# Patient Record
Sex: Male | Born: 2000 | Race: White | Hispanic: No | Marital: Single | Smoking: Former smoker
Health system: Southern US, Community
[De-identification: ages and names within clinical notes are randomized; demographics above are authoritative.]

## PROBLEM LIST (undated history)

## (undated) DIAGNOSIS — N2881 Hypertrophy of kidney: Secondary | ICD-10-CM

## (undated) DIAGNOSIS — N5089 Other specified disorders of the male genital organs: Secondary | ICD-10-CM

## (undated) HISTORY — PX: APPENDECTOMY: SHX54

---
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
## 2018-07-24 ENCOUNTER — Emergency Department (HOSPITAL_COMMUNITY)
Admission: EM | Admit: 2018-07-24 | Discharge: 2018-07-24 | Disposition: A | Discharge status: Home / Self Care | Payer: No Typology Code available for payment source | Attending: Emergency Medicine | Admitting: Emergency Medicine

## 2018-07-24 ENCOUNTER — Encounter (HOSPITAL_COMMUNITY): Payer: Self-pay

## 2018-07-24 ENCOUNTER — Emergency Department (HOSPITAL_COMMUNITY): Payer: No Typology Code available for payment source

## 2018-07-24 ENCOUNTER — Other Ambulatory Visit: Payer: Self-pay

## 2018-07-24 DIAGNOSIS — Y92410 Unspecified street and highway as the place of occurrence of the external cause: Secondary | ICD-10-CM | POA: Insufficient documentation

## 2018-07-24 DIAGNOSIS — F17210 Nicotine dependence, cigarettes, uncomplicated: Secondary | ICD-10-CM | POA: Diagnosis not present

## 2018-07-24 DIAGNOSIS — Y9389 Activity, other specified: Secondary | ICD-10-CM | POA: Insufficient documentation

## 2018-07-24 DIAGNOSIS — S0990XA Unspecified injury of head, initial encounter: Secondary | ICD-10-CM | POA: Diagnosis present

## 2018-07-24 DIAGNOSIS — Y999 Unspecified external cause status: Secondary | ICD-10-CM | POA: Diagnosis not present

## 2018-07-24 DIAGNOSIS — S0003XA Contusion of scalp, initial encounter: Secondary | ICD-10-CM | POA: Insufficient documentation

## 2018-07-24 NOTE — Discharge Instructions (Signed)
Take over the counter tylenol, as directed on packaging, as needed for discomfort.  No gym or sports for the next 2 weeks, and seen in follow up by your regular medical doctor.  Call your regular medical doctor today to schedule a follow up appointment within the next 3 days.  Return to the Emergency Department immediately sooner if worsening.
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
## 2019-01-09 ENCOUNTER — Other Ambulatory Visit: Payer: Self-pay

## 2019-01-09 ENCOUNTER — Emergency Department (HOSPITAL_COMMUNITY): Payer: Self-pay

## 2019-01-09 ENCOUNTER — Encounter (HOSPITAL_COMMUNITY): Payer: Self-pay

## 2019-01-09 ENCOUNTER — Emergency Department (HOSPITAL_COMMUNITY)
Admission: EM | Admit: 2019-01-09 | Discharge: 2019-01-09 | Disposition: A | Discharge status: Home / Self Care | Payer: Self-pay | Attending: Emergency Medicine | Admitting: Emergency Medicine

## 2019-01-09 DIAGNOSIS — F17290 Nicotine dependence, other tobacco product, uncomplicated: Secondary | ICD-10-CM | POA: Insufficient documentation

## 2019-01-09 DIAGNOSIS — R109 Unspecified abdominal pain: Secondary | ICD-10-CM

## 2019-01-09 DIAGNOSIS — R1031 Right lower quadrant pain: Secondary | ICD-10-CM | POA: Insufficient documentation

## 2019-01-09 LAB — URINALYSIS, ROUTINE W REFLEX MICROSCOPIC
Bilirubin Urine: NEGATIVE
Glucose, UA: NEGATIVE mg/dL
Hgb urine dipstick: NEGATIVE
Ketones, ur: NEGATIVE mg/dL
Leukocytes,Ua: NEGATIVE
Nitrite: NEGATIVE
Protein, ur: 30 mg/dL — AB
Specific Gravity, Urine: 1.034 — ABNORMAL HIGH (ref 1.005–1.030)
pH: 7 (ref 5.0–8.0)

## 2019-01-09 LAB — COMPREHENSIVE METABOLIC PANEL
ALT: 15 U/L (ref 0–44)
AST: 20 U/L (ref 15–41)
Albumin: 5.2 g/dL — ABNORMAL HIGH (ref 3.5–5.0)
Alkaline Phosphatase: 59 U/L (ref 38–126)
Anion gap: 11 (ref 5–15)
BUN: 16 mg/dL (ref 6–20)
CO2: 24 mmol/L (ref 22–32)
Calcium: 9.7 mg/dL (ref 8.9–10.3)
Chloride: 103 mmol/L (ref 98–111)
Creatinine, Ser: 0.91 mg/dL (ref 0.61–1.24)
GFR calc Af Amer: >60 mL/min (ref >60)
GFR calc non Af Amer: >60 mL/min (ref >60)
Glucose, Bld: 105 mg/dL — ABNORMAL HIGH (ref 70–99)
Potassium: 3.4 mmol/L — ABNORMAL LOW (ref 3.5–5.1)
Sodium: 138 mmol/L (ref 135–145)
Total Bilirubin: 1 mg/dL (ref 0.3–1.2)
Total Protein: 8.3 g/dL — ABNORMAL HIGH (ref 6.5–8.1)

## 2019-01-09 LAB — CBC
HCT: 47.9 % (ref 39.0–52.0)
Hemoglobin: 15.7 g/dL (ref 13.0–17.0)
MCH: 30.6 pg (ref 26.0–34.0)
MCHC: 32.8 g/dL (ref 30.0–36.0)
MCV: 93.4 fL (ref 80.0–100.0)
Platelets: 284 10*3/uL (ref 150–400)
RBC: 5.13 MIL/uL (ref 4.22–5.81)
RDW: 11.9 % (ref 11.5–15.5)
WBC: 9.3 10*3/uL (ref 4.0–10.5)
nRBC: 0 % (ref 0.0–0.2)

## 2019-01-09 LAB — LIPASE, BLOOD: Lipase: 22 U/L (ref 11–51)

## 2019-01-09 MED ORDER — IOHEXOL 300 MG/ML  SOLN
100.0000 mL | Freq: Once | INTRAMUSCULAR | Status: AC | PRN
  Administered 2019-01-09: 100 mL via INTRAVENOUS

## 2019-01-09 MED ORDER — ONDANSETRON 4 MG PO TBDP: 4.0000 mg | ORAL_TABLET | Freq: Three times a day (TID) | ORAL | 0 refills | Status: DC | PRN

## 2019-01-09 MED ORDER — SODIUM CHLORIDE 0.9% FLUSH
3.0000 mL | Freq: Once | INTRAVENOUS | Status: AC
  Administered 2019-01-09: 3 mL via INTRAVENOUS

## 2019-01-09 NOTE — Discharge Instructions (Signed)
°  Nausea/vomiting: Use the ondansetron (generic for Zofran) for nausea or vomiting.  This medication may not prevent all vomiting or nausea, but can help facilitate better hydration. Things that can help with nausea/vomiting also include peppermint/menthol candies, vitamin B12, and ginger. Acetaminophen: May take acetaminophen (generic for Tylenol), as needed, for pain. Your daily total maximum amount of acetaminophen from all sources should be limited to 4000mg /day for persons without liver problems, or 2000mg /day for those with liver problems. Follow-up: Follow-up with the urologist on this matter.  Call the number provided tomorrow to be seen in the office Wednesday, October 28. Return: Return to the emergency department if you are unable to urinate, have a fever, have persistent vomiting, worsening persistent pain, or any other major concerns.

## 2019-01-09 NOTE — ED Triage Notes (Signed)
Pt had an enlarged kidney 4 years ago. Was told by PCP to come be seen if he had any pain. Pain is in right kidney. No urinary symptoms today

## 2019-01-09 NOTE — ED Provider Notes (Signed)
[facility] EMERGENCY DEPARTMENT Provider Note   CSN: [id number] Arrival date & time: 01/09/19  1445     History   Chief Complaint Chief Complaint  Patient presents with   Flank Pain    HPI Chad Odonnell is a 18 y.o. male.     HPI   Chad Odonnell is a 18 y.o. male, with a history of large right kidney, presenting to the ED with right lower back pain originally beginning about 2 months ago.  It was intermittent and mild at that time. About a month ago, patient had decreased urination.  Previously urinating multiple times a day, now urinating about once every 2 days. 2 days ago, began having severe right lower back pain, feels like a clamping, intermittent, radiating into the right flank.   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  Denies fever/chills, persistent abdominal pain, N/V/C/D, hematuria, dysuria, or any other complaints.     Past Medical History:  Diagnosis Date   Enlarged kidney    right side   Testicle swelling right bigger than left    There are no active problems to display for this patient.   Past Surgical History:  Procedure Laterality Date   APPENDECTOMY     PYELOPLASTY Right 11/05/2013   [facility]   URETERAL STENT PLACEMENT  11/05/2013        Home Medications    Prior to Admission medications   Medication Sig Start Date End Date Taking? Authorizing Provider  ondansetron (ZOFRAN ODT) 4 MG disintegrating tablet Take 1 tablet (4 mg total) by mouth every 8 (eight) hours as needed for nausea or vomiting. 01/09/19   Laurynn Mccorvey, Hillard DankerShawn C, PA-C    Family History No family history on file.  Social History Social History   Tobacco Use   Smoking status: Current Every Day Smoker    Packs/day: 0.50    Types: Cigars   Smokeless tobacco: Never Used  Substance Use Topics     Alcohol use: No   Drug use: Yes    Types: Marijuana    Comment: last use last week     Allergies   Patient has no known allergies.   Review of Systems Review of Systems  Constitutional: Negative for chills, diaphoresis and fever.  Respiratory: Negative for cough and shortness of breath.   Cardiovascular: Negative for chest pain.  Gastrointestinal: Positive for nausea and vomiting. Negative for abdominal pain, blood in stool and diarrhea.  Genitourinary: Positive for decreased urine volume and flank pain. Negative for dysuria and hematuria.  Musculoskeletal: Positive for back pain.  Neurological: Negative for syncope and weakness.  All other systems reviewed and are negative.    Physical Exam Updated Vital Signs BP (!) 144/86 (BP Location: Right Arm)    Pulse 70    Temp 98.7 F (37.1 C) (Oral)    Ht 5\' 9"  (1.753 m)    Wt 68 kg    SpO2 97%    BMI 22.15 kg/m   Physical Exam Vitals signs and nursing note reviewed.  Constitutional:      General: He is not in acute distress.    Appearance: He is well-developed. He is not diaphoretic.  HENT:     Head: Normocephalic and atraumatic.     Mouth/Throat:     Mouth: Mucous membranes are moist.     Pharynx: Oropharynx  is clear.  Eyes:     Conjunctiva/sclera: Conjunctivae normal.  Neck:     Musculoskeletal: Neck supple.  Cardiovascular:     Rate and Rhythm: Normal rate and regular rhythm.     Pulses: Normal pulses.     Heart sounds: Normal heart sounds.  Pulmonary:     Effort: Pulmonary effort is normal. No respiratory distress.     Breath sounds: Normal breath sounds.  Abdominal:     Palpations: Abdomen is soft.     Tenderness: There is abdominal tenderness. There is right CVA tenderness. There is no guarding.    Musculoskeletal:     Right lower leg: No edema.     Left lower leg: No edema.  Lymphadenopathy:     Cervical: No cervical adenopathy.  Skin:    General: Skin is warm and dry.  Neurological:     Mental  Status: He is alert.  Psychiatric:        Mood and Affect: Mood and affect normal.        Speech: Speech normal.        Behavior: Behavior normal.      ED Treatments / Results  Labs (all labs ordered are listed, but only abnormal results are displayed) Labs Reviewed  COMPREHENSIVE METABOLIC PANEL - Abnormal; Notable for the following components:      Result Value   Potassium 3.4 (*)    Glucose, Bld 105 (*)    Total Protein 8.3 (*)    Albumin 5.2 (*)    All other components within normal limits  URINALYSIS, ROUTINE W REFLEX MICROSCOPIC - Abnormal; Notable for the following components:   Specific Gravity, Urine 1.034 (*)    Protein, ur 30 (*)    Bacteria, UA FEW (*)    All other components within normal limits  LIPASE, BLOOD  CBC    EKG None  Radiology Ct Abdomen Pelvis W Contrast  Result Date: 01/09/2019 CLINICAL DATA:  18 year old male with history of right-sided flank pain for the past 2 days without hematuria. EXAM: CT ABDOMEN AND PELVIS WITH CONTRAST TECHNIQUE: Multidetector CT imaging of the abdomen and pelvis was performed using the standard protocol following bolus administration of intravenous contrast. CONTRAST:  133mL OMNIPAQUE IOHEXOL 300 MG/ML  SOLN COMPARISON:  CT the abdomen and pelvis 12/16/2013. FINDINGS: Lower chest: Unremarkable. Hepatobiliary: No suspicious cystic or solid hepatic lesions. No intra or extrahepatic biliary ductal dilatation. Gallbladder is normal in appearance. Pancreas: No pancreatic mass. No pancreatic ductal dilatation. No pancreatic or peripancreatic fluid collections or inflammatory changes. Spleen: Unremarkable. Adrenals/Urinary Tract: Subcentimeter low-attenuation lesions in the upper pole of the right kidney, too small to characterize, but statistically likely to represent tiny cysts. Right kidney and bilateral adrenal glands are normal in appearance. Very mild fullness of the right renal collecting system, without frank hydronephrosis. No  left hydroureteronephrosis. Urinary bladder is normal in appearance. Stomach/Bowel: Normal appearance of the stomach. No pathologic dilatation of small bowel or colon. The appendix is not confidently identified and may be surgically absent. Regardless, there are no inflammatory changes noted adjacent to the cecum to suggest the presence of an acute appendicitis at this time. Vascular/Lymphatic: No significant atherosclerotic disease, aneurysm or dissection in the abdominal or pelvic vasculature. No lymphadenopathy noted in the abdomen or pelvis. Reproductive: Prostate gland and seminal vesicles are unremarkable in appearance. Other: No significant volume of ascites.  No pneumoperitoneum. Musculoskeletal: There are no aggressive appearing lytic or blastic lesions noted in the visualized portions of the skeleton.  IMPRESSION: 1. Minimal fullness of the right renal collecting system without frank hydronephrosis. This is nonspecific, but correlation with urinalysis is recommended. 2. No other acute findings are noted to account for the patient's symptoms. Electronically Signed   By: Trudie Reed M.D.   On: 01/09/2019 20:10    Procedures Procedures (including critical care time)  Medications Ordered in ED Medications  sodium chloride flush (NS) 0.9 % injection 3 mL (3 mLs Intravenous Given 01/09/19 1932)  iohexol (OMNIPAQUE) 300 MG/ML solution 100 mL (100 mLs Intravenous Contrast Given 01/09/19 1958)     Initial Impression / Assessment and Plan / ED Course  I have reviewed the triage vital signs and the nursing notes.  Pertinent labs & imaging results that were available during my care of the patient were reviewed by me and considered in my medical decision making (see chart for details).  Clinical Course as of Jan 10 1633  Thu Jan 09, 2019  2032 Spoke with Dr. Ronne Binning, urologist.  We discussed patient's CT and lab findings as well as his symptoms, including decreased urination. Agrees with  office follow-up.  No additional instructions.   [SJ]    Clinical Course User Index [SJ] Adeleigh Barletta C, PA-C       Patient presents with flank pain. Patient is nontoxic appearing, afebrile, not tachycardic, not tachypneic, not hypotensive, maintains excellent SPO2 on room air, and is in no apparent distress.  Minimal fullness in the renal collecting system.  No noted difficulty urinating while here in the ED.  Urology follow-up. Return precautions discussed.  Patient voices understanding of these instructions, accepts the plan, and is comfortable with discharge.  Final Clinical Impressions(s) / ED Diagnoses   Final diagnoses:  Flank pain    ED Discharge Orders         Ordered    ondansetron (ZOFRAN ODT) 4 MG disintegrating tablet  Every 8 hours PRN     01/09/19 2054           Anselm Pancoast, PA-C 01/11/19 1635    Vanetta Mulders, MD 01/20/19 1927

## 2019-01-09 NOTE — ED Notes (Signed)
Pt drinking water at this time.

## 2020-07-13 IMAGING — CT CT ABD-PELV W/ CM
2 of 4 series · 16 of 46 positions shown, 18 images · IV contrast (omnipaque)
Comparison: CT the abdomen and pelvis 12/16/2013.

CLINICAL DATA: 18-year-old male with history of right-sided flank
pain for the past 2 days without hematuria.

EXAM:
CT ABDOMEN AND PELVIS WITH CONTRAST
TECHNIQUE: Multidetector CT imaging of the abdomen and pelvis was performed
using the standard protocol following bolus administration of
intravenous contrast.
CONTRAST:  100mL OMNIPAQUE IOHEXOL 300 MG/ML  SOLN

[Series 2: axial st · axial · 0.66mm/px · z∈[+640,+1030]mm · 13 of 90 slices shown, 15 images]
[im 6/90  soft-tissue]
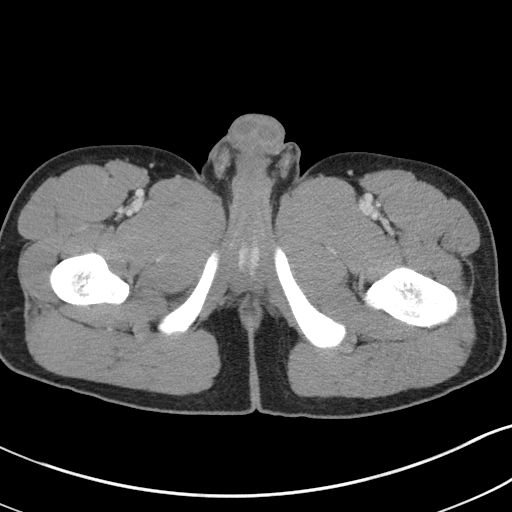
[im 6/90  bone]
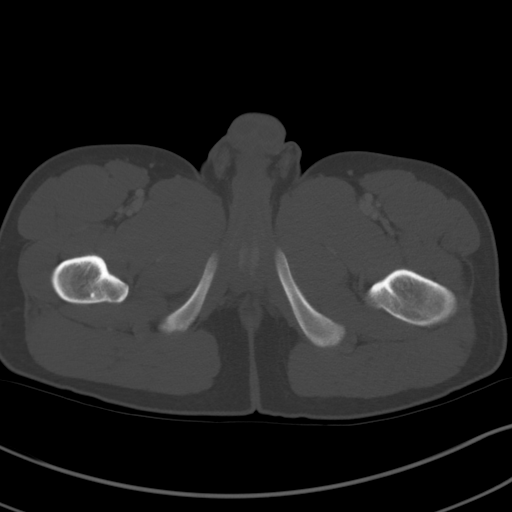
[im 12/90  soft-tissue]
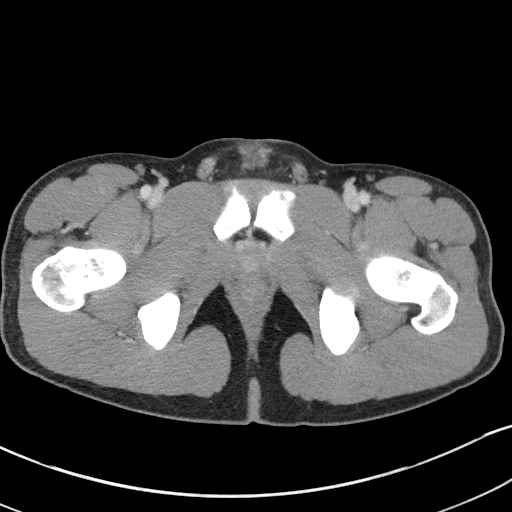
[im 17/90  soft-tissue]
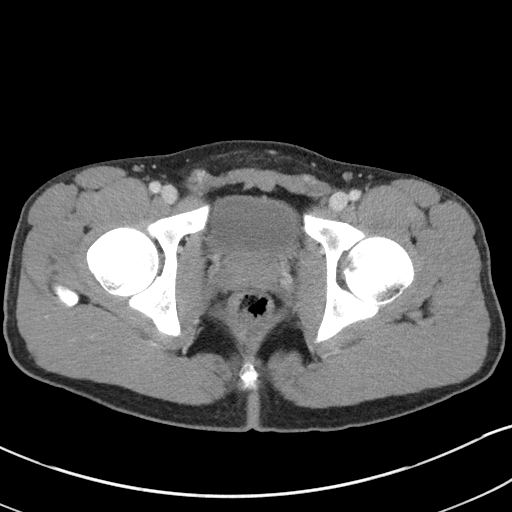
[im 28/90  soft-tissue]
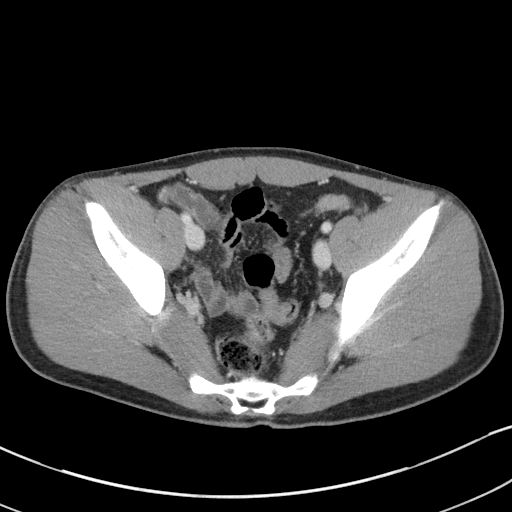
[im 34/90  soft-tissue]
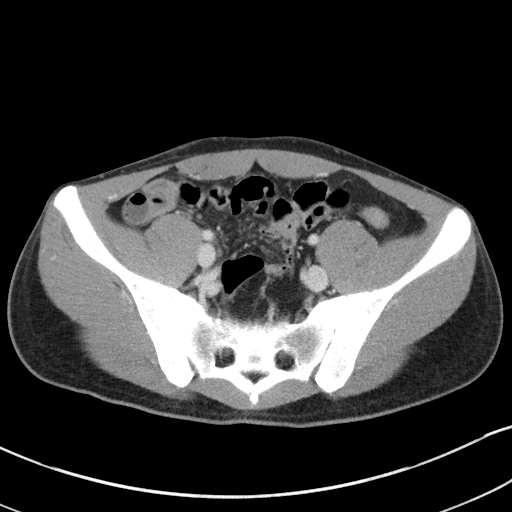
[im 39/90  soft-tissue]
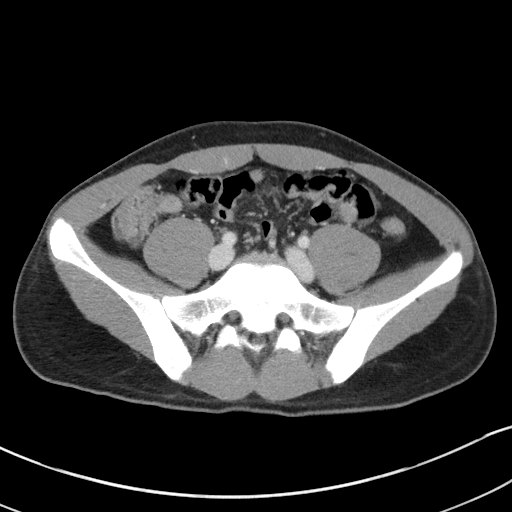
[im 45/90  soft-tissue]
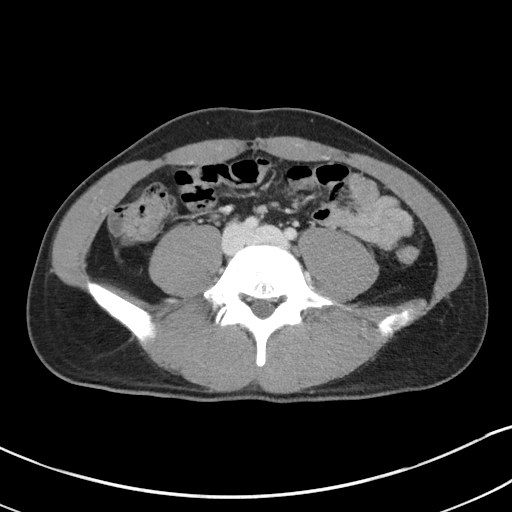
[im 51/90  soft-tissue]
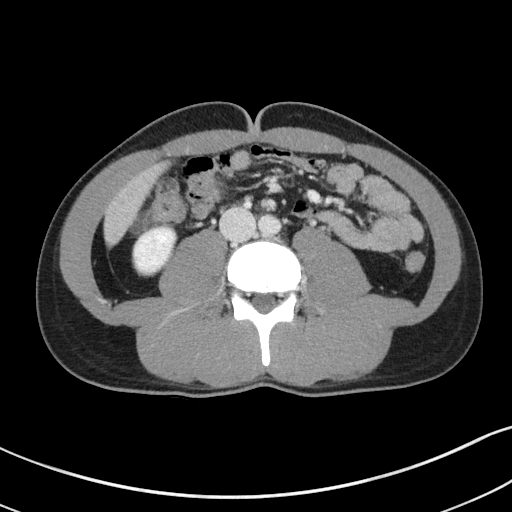
[im 56/90  soft-tissue]
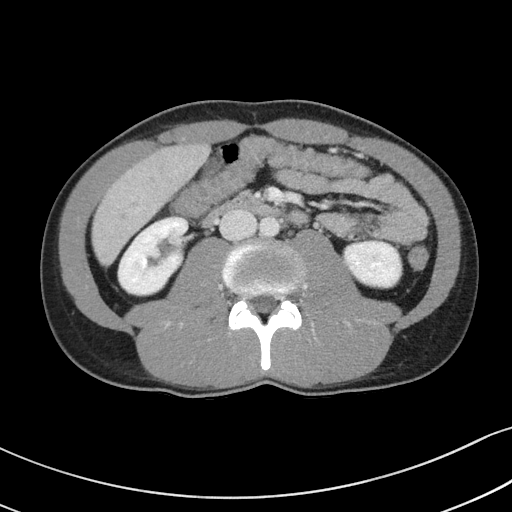
[im 56/90  bone]
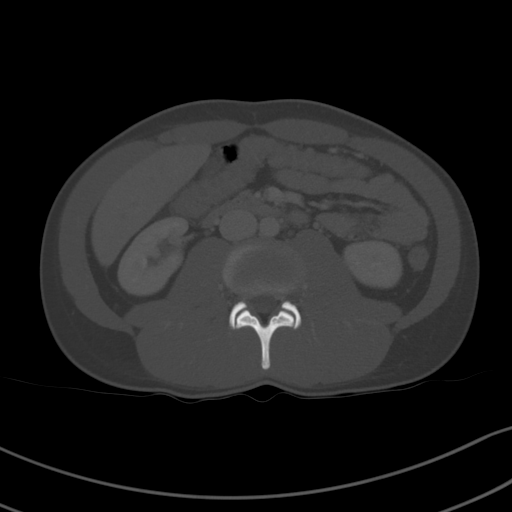
[im 62/90  soft-tissue]
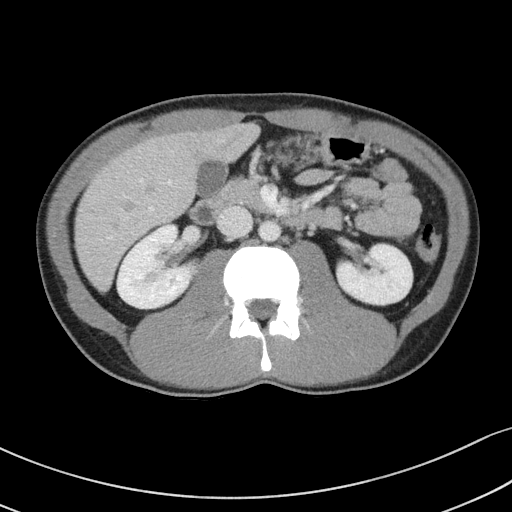
[im 73/90  soft-tissue]
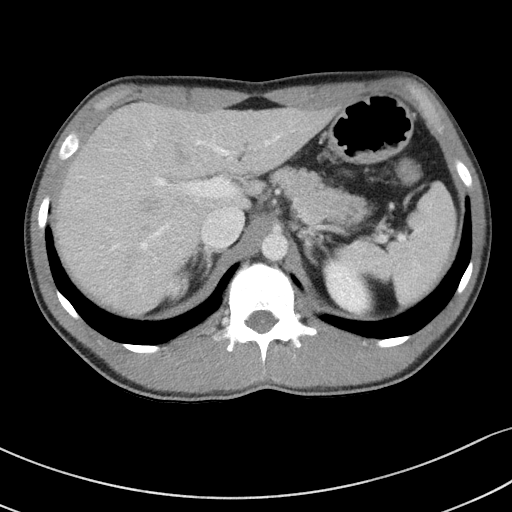
[im 78/90  soft-tissue]
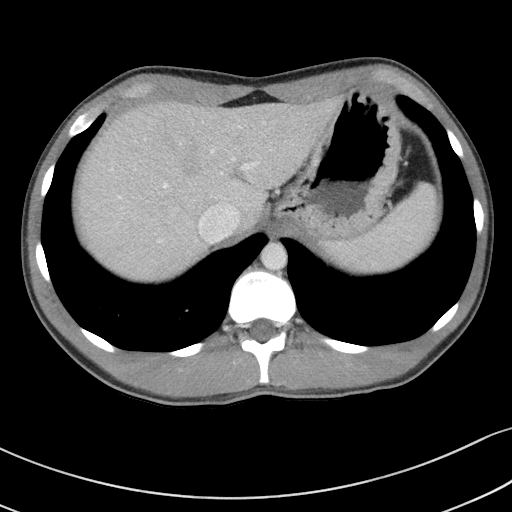
[im 84/90  soft-tissue]
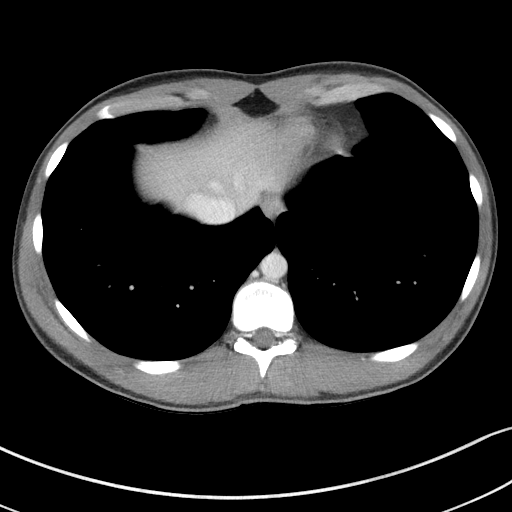

[Series 5: coronal st · coronal · 0.72mm/px · 3 of 80 slices shown]
[im 27/80  soft-tissue]
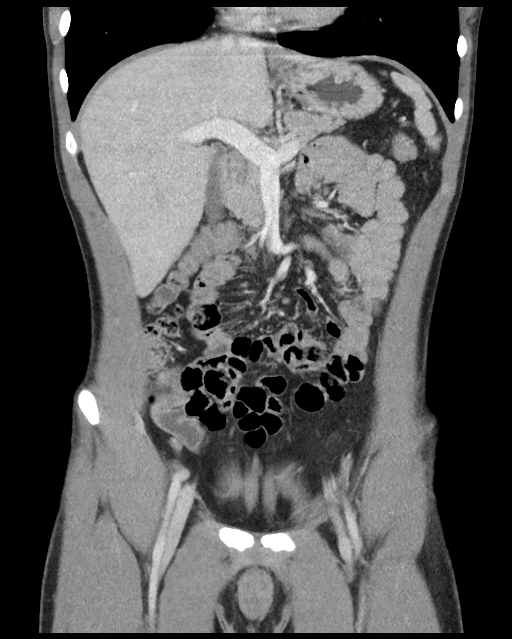
[im 36/80  soft-tissue]
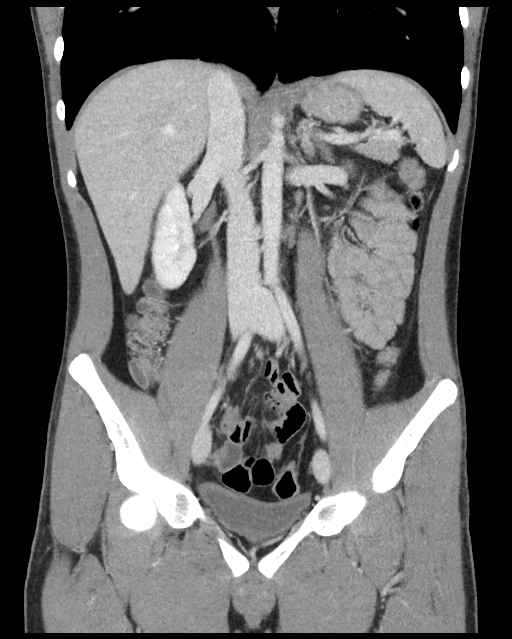
[im 44/80  soft-tissue]
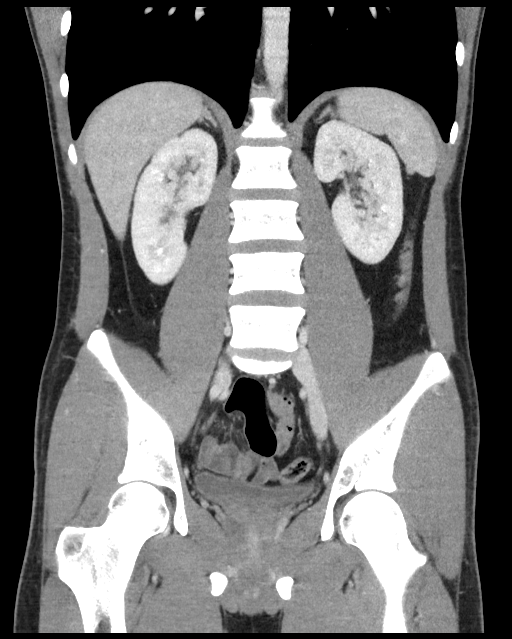

[16 of 46 positions shown; findings below may reference images not displayed]

FINDINGS: Lower chest: Unremarkable.

Hepatobiliary: No suspicious cystic or solid hepatic lesions. No
intra or extrahepatic biliary ductal dilatation. Gallbladder is
normal in appearance.

Pancreas: No pancreatic mass. No pancreatic ductal dilatation. No
pancreatic or peripancreatic fluid collections or inflammatory
changes.

Spleen: Unremarkable.

Adrenals/Urinary Tract: Subcentimeter low-attenuation lesions in the
upper pole of the right kidney, too small to characterize, but
statistically likely to represent tiny cysts. Right kidney and
bilateral adrenal glands are normal in appearance. Very mild
fullness of the right renal collecting system, without frank
hydronephrosis. No left hydroureteronephrosis. Urinary bladder is
normal in appearance.

Stomach/Bowel: Normal appearance of the stomach. No pathologic
dilatation of small bowel or colon. The appendix is not confidently
identified and may be surgically absent. Regardless, there are no
inflammatory changes noted adjacent to the cecum to suggest the
presence of an acute appendicitis at this time.

Vascular/Lymphatic: No significant atherosclerotic disease, aneurysm
or dissection in the abdominal or pelvic vasculature. No
lymphadenopathy noted in the abdomen or pelvis.

Reproductive: Prostate gland and seminal vesicles are unremarkable
in appearance.

Other: No significant volume of ascites.  No pneumoperitoneum.

Musculoskeletal: There are no aggressive appearing lytic or blastic
lesions noted in the visualized portions of the skeleton.
IMPRESSION: 1. Minimal fullness of the right renal collecting system without
frank hydronephrosis. This is nonspecific, but correlation with
urinalysis is recommended.
2. No other acute findings are noted to account for the patient's
symptoms.

## 2021-11-05 ENCOUNTER — Emergency Department (EMERGENCY_DEPARTMENT_HOSPITAL)
Admission: EM | Admit: 2021-11-05 | Discharge: 2021-11-06 | Disposition: A | Discharge status: Other Acute Inpatient Hospital | Payer: Medicaid - Out of State | Source: Home / Self Care | Attending: Emergency Medicine | Admitting: Emergency Medicine

## 2021-11-05 ENCOUNTER — Encounter (HOSPITAL_COMMUNITY): Payer: Self-pay

## 2021-11-05 DIAGNOSIS — R109 Unspecified abdominal pain: Secondary | ICD-10-CM | POA: Insufficient documentation

## 2021-11-05 DIAGNOSIS — Z20822 Contact with and (suspected) exposure to covid-19: Secondary | ICD-10-CM | POA: Insufficient documentation

## 2021-11-05 DIAGNOSIS — R45850 Homicidal ideations: Secondary | ICD-10-CM | POA: Insufficient documentation

## 2021-11-05 DIAGNOSIS — R45851 Suicidal ideations: Secondary | ICD-10-CM

## 2021-11-05 DIAGNOSIS — F339 Major depressive disorder, recurrent, unspecified: Secondary | ICD-10-CM | POA: Insufficient documentation

## 2021-11-05 DIAGNOSIS — E876 Hypokalemia: Secondary | ICD-10-CM | POA: Insufficient documentation

## 2021-11-05 DIAGNOSIS — Y900 Blood alcohol level of less than 20 mg/100 ml: Secondary | ICD-10-CM | POA: Insufficient documentation

## 2021-11-05 LAB — CBC WITH DIFFERENTIAL/PLATELET
Abs Immature Granulocytes: 0.02 10*3/uL (ref 0.00–0.07)
Basophils Absolute: 0.1 10*3/uL (ref 0.0–0.1)
Basophils Relative: 1 %
Eosinophils Absolute: 0.1 10*3/uL (ref 0.0–0.5)
Eosinophils Relative: 1 %
HCT: 42.2 % (ref 39.0–52.0)
Hemoglobin: 15 g/dL (ref 13.0–17.0)
Immature Granulocytes: 0 %
Lymphocytes Relative: 26 %
Lymphs Abs: 2.2 10*3/uL (ref 0.7–4.0)
MCH: 30.5 pg (ref 26.0–34.0)
MCHC: 35.5 g/dL (ref 30.0–36.0)
MCV: 85.8 fL (ref 80.0–100.0)
Monocytes Absolute: 0.8 10*3/uL (ref 0.1–1.0)
Monocytes Relative: 9 %
Neutro Abs: 5.5 10*3/uL (ref 1.7–7.7)
Neutrophils Relative %: 63 %
Platelets: 316 10*3/uL (ref 150–400)
RBC: 4.92 MIL/uL (ref 4.22–5.81)
RDW: 11.7 % (ref 11.5–15.5)
WBC: 8.7 10*3/uL (ref 4.0–10.5)
nRBC: 0 % (ref 0.0–0.2)

## 2021-11-05 LAB — COMPREHENSIVE METABOLIC PANEL
ALT: 11 U/L (ref 0–44)
AST: 14 U/L — ABNORMAL LOW (ref 15–41)
Albumin: 4.7 g/dL (ref 3.5–5.0)
Alkaline Phosphatase: 53 U/L (ref 38–126)
Anion gap: 9 (ref 5–15)
BUN: 13 mg/dL (ref 6–20)
CO2: 25 mmol/L (ref 22–32)
Calcium: 9.3 mg/dL (ref 8.9–10.3)
Chloride: 102 mmol/L (ref 98–111)
Creatinine, Ser: 0.9 mg/dL (ref 0.61–1.24)
GFR, Estimated: >60 mL/min (ref >60)
Glucose, Bld: 96 mg/dL (ref 70–99)
Potassium: 3.3 mmol/L — ABNORMAL LOW (ref 3.5–5.1)
Sodium: 136 mmol/L (ref 135–145)
Total Bilirubin: 1.3 mg/dL — ABNORMAL HIGH (ref 0.3–1.2)
Total Protein: 7.6 g/dL (ref 6.5–8.1)

## 2021-11-05 LAB — RAPID URINE DRUG SCREEN, HOSP PERFORMED
Amphetamines: NOT DETECTED
Barbiturates: NOT DETECTED
Benzodiazepines: NOT DETECTED
Cocaine: NOT DETECTED
Opiates: NOT DETECTED
Tetrahydrocannabinol: POSITIVE — AB

## 2021-11-05 LAB — ETHANOL: Alcohol, Ethyl (B): <10 mg/dL (ref <10)

## 2021-11-05 LAB — ACETAMINOPHEN LEVEL: Acetaminophen (Tylenol), Serum: <10 ug/mL — ABNORMAL LOW (ref 10–30)

## 2021-11-05 LAB — SALICYLATE LEVEL: Salicylate Lvl: <7 mg/dL — ABNORMAL LOW (ref 7.0–30.0)

## 2021-11-05 MED ORDER — LORAZEPAM 1 MG PO TABS
1.0000 mg | ORAL_TABLET | Freq: Once | ORAL | Status: AC
  Administered 2021-11-05: 1 mg via ORAL
  Filled 2021-11-05: qty 1

## 2021-11-05 MED ORDER — ZIPRASIDONE MESYLATE 20 MG IM SOLR
20.0000 mg | INTRAMUSCULAR | Status: DC | PRN
Start: 2021-11-05 — End: 2021-11-06

## 2021-11-05 MED ORDER — TRAZODONE HCL 50 MG PO TABS
50.0000 mg | ORAL_TABLET | Freq: Every evening | ORAL | Status: DC | PRN
  Administered 2021-11-06: 50 mg via ORAL
  Filled 2021-11-05: qty 1

## 2021-11-05 MED ORDER — NICOTINE 21 MG/24HR TD PT24
21.0000 mg | MEDICATED_PATCH | Freq: Once | TRANSDERMAL | Status: AC
  Administered 2021-11-05: 21 mg via TRANSDERMAL
  Filled 2021-11-05: qty 1

## 2021-11-05 MED ORDER — ZIPRASIDONE MESYLATE 20 MG IM SOLR
20.0000 mg | Freq: Once | INTRAMUSCULAR | Status: AC
  Administered 2021-11-05: 20 mg via INTRAMUSCULAR
  Filled 2021-11-05: qty 20

## 2021-11-05 MED ORDER — HYDROXYZINE HCL 25 MG PO TABS
25.0000 mg | ORAL_TABLET | Freq: Three times a day (TID) | ORAL | Status: DC | PRN
  Administered 2021-11-06: 25 mg via ORAL
  Filled 2021-11-05: qty 1

## 2021-11-05 MED ORDER — FLUOXETINE HCL 20 MG PO CAPS
20.0000 mg | ORAL_CAPSULE | Freq: Every day | ORAL | Status: DC
  Administered 2021-11-06: 20 mg via ORAL
  Filled 2021-11-05: qty 1

## 2021-11-05 MED ORDER — STERILE WATER FOR INJECTION IJ SOLN
INTRAMUSCULAR | Status: AC
  Filled 2021-11-05: qty 10

## 2021-11-05 NOTE — ED Notes (Signed)
TTS in progress 

## 2021-11-05 NOTE — ED Notes (Signed)
Pt requesting another TTS consult. This RN consulted Brooke A Leevy-Johnson who simply replied "no" to the request. Charge RN made aware.

## 2021-11-05 NOTE — ED Provider Notes (Signed)
[facility] EMERGENCY DEPARTMENT Provider Note   CSN: [id number] Arrival date & time: 11/05/21  1251     History Chief Complaint  Patient presents with   Suicidal   Homicidal    Chad Odonnell is a 21 y.o. male patient who presents to the emergency department for further evaluation of suicidal ideations which has been chronic for some time but worsening over the last few days.  He is also having homicidal ideations towards his daughter's mother who he feels is trying to take the daughter away from him.  He has had to require inpatient psychiatric treatment in the past.  He reports associated abdominal pain currently as he has not eaten.  He denies any auditory or visual hallucinations.  He has had previous suicidal attempts in the past.  HPI     Home Medications Prior to Admission medications   Medication Sig Start Date End Date Taking? Authorizing Provider  ondansetron (ZOFRAN ODT) 4 MG disintegrating tablet Take 1 tablet (4 mg total) by mouth every 8 (eight) hours as needed for nausea or vomiting. 01/09/19   Joy, Hillard Danker, PA-C      Allergies    Patient has no known allergies.    Review of Systems   Review of Systems  All other systems reviewed and are negative.   Physical Exam Updated Vital Signs BP (!) 148/109   Pulse 87   Temp 98.5 F (36.9 C) (Oral)   Resp 16   Ht 5\' 9"  (1.753 m)   Wt 58.6 kg   SpO2 100%   BMI 19.08 kg/m  Physical Exam Vitals and nursing note reviewed.  Constitutional:      General: He is not in acute distress.    Appearance: Normal appearance.  HENT:     Head: Normocephalic and atraumatic.  Eyes:     General:        Right eye: No discharge.        Left eye: No discharge.  Cardiovascular:     Comments: Regular rate and rhythm.  S1/S2 are distinct without any evidence of murmur, rubs, or gallops.  Radial pulses are 2+ bilaterally.  Dorsalis pedis pulses are 2+ bilaterally.  No evidence of pedal edema. Pulmonary:     Comments: Clear  to auscultation bilaterally.  Normal effort.  No respiratory distress.  No evidence of wheezes, rales, or rhonchi heard throughout. Abdominal:     General: Abdomen is flat. Bowel sounds are normal. There is no distension.     Tenderness: There is no abdominal tenderness. There is no guarding or rebound.  Musculoskeletal:        General: Normal range of motion.     Cervical back: Neck supple.  Skin:    General: Skin is warm and dry.     Findings: No rash.  Neurological:     General: No focal deficit present.     Mental Status: He is alert.  Psychiatric:        Mood and Affect: Mood normal.        Behavior: Behavior normal.     ED Results / Procedures / Treatments   Labs (all labs ordered are listed, but only abnormal results are displayed) Labs Reviewed  COMPREHENSIVE METABOLIC PANEL - Abnormal; Notable for the following components:      Result Value   Potassium 3.3 (*)    AST 14 (*)    Total Bilirubin 1.3 (*)    All other components within normal limits  RAPID  URINE DRUG SCREEN, HOSP PERFORMED - Abnormal; Notable for the following components:   Tetrahydrocannabinol POSITIVE (*)    All other components within normal limits  SALICYLATE LEVEL - Abnormal; Notable for the following components:   Salicylate Lvl <7.0 (*)    All other components within normal limits  ACETAMINOPHEN LEVEL - Abnormal; Notable for the following components:   Acetaminophen (Tylenol), Serum <10 (*)    All other components within normal limits  RESP PANEL BY RT-PCR (FLU A&B, COVID) [id number]  ETHANOL  CBC WITH DIFFERENTIAL/PLATELET    EKG None  Radiology No results found.  Procedures Procedures    Medications Ordered in ED Medications  nicotine (NICODERM CQ - dosed in mg/24 hours) patch 21 mg (21 mg Transdermal Patch Applied 11/05/21 1454)    ED Course/ Medical Decision Making/ A&P Clinical Course as of 11/05/21 1749  Sat Nov 05, 2021  1748 Acetaminophen level(!) Normal. [CF]  1748  Salicylate level(!) Normal. [CF]  1748 Comprehensive metabolic panel(!) Mild hypokalemia. [CF]  1748 CBC with Diff Normal. [CF]  1748 Ethanol Negative. [CF]  1749 Urine rapid drug screen (hosp performed)(!) Positive for THC. [CF]    Clinical Course User Index [CF] Teressa Lower, PA-C                           Medical Decision Making Chad Odonnell is a 21 y.o. male patient who presents to the emergency department today for further evaluation of suicidal and homicidal ideations.  Patient is at high risk I feel based on history.  I do feel the patient would meet inpatient criteria and TTS consultation.  Medical clearance labs were ordered.  I will hold off on imaging for now until labs return.  I will plan to reassess.  Patient is medically cleared.  Patient was trying to leave.  I ultimately IVC him for his own safety.  He is medically cleared and TTS is consulted.  Disposition to be made by oncoming provider per psychiatry's recommendations.  Amount and/or Complexity of Data Reviewed Labs: ordered.  Risk OTC drugs.    Final Clinical Impression(s) / ED Diagnoses Final diagnoses:  None    Rx / DC Orders ED Discharge Orders     None         Teressa Lower, [place] 11/05/21 1749    Gerhard Munch, MD 11/06/21 [phone]

## 2021-11-05 NOTE — ED Notes (Signed)
Pt came to nurses station requesting to check out. Meredeth Ide, [place] made aware and initiated IVC process. Pt's mother updated on pt status and IVC process. Mother states she will be coming to the hospital to see pt shortly.

## 2021-11-05 NOTE — ED Notes (Signed)
Pt to nurses station to apologize for previous behavior. Stated he is just in a dark place at the moment and didn't realize he wouldn't be able to leave when he wanted to. States he is tired now and wishes to take a nap.

## 2021-11-05 NOTE — ED Notes (Signed)
Pt wanded by security. 

## 2021-11-05 NOTE — ED Notes (Signed)
Pt dressed out into burgundy scrubs, mom at bedside. Pt could not provide urine sample at the time but is understanding that when he is able to provide a sample he will be asked to. Pt and mom both in agreement that mom will leave with ALL personal belongings including clothes shoes and cell phone. Belongings put into personal belongings bag and labeled with pt name and given to mother.

## 2021-11-05 NOTE — ED Notes (Signed)
Officer at bedside to serve IVC paperwork to pt.

## 2021-11-05 NOTE — ED Notes (Signed)
Pt to nurses station, yelling and cursing at staff demanding another TTS, wanting to speak to MD or psychiatrist. Pt asked multiple times to return to his bed and stop cursing. Pt refused, [employer] and security at bedside. Charge RN en route to get medication orders.
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
## 2021-11-06 ENCOUNTER — Encounter (HOSPITAL_COMMUNITY): Payer: Self-pay | Admitting: Psychiatry

## 2021-11-06 ENCOUNTER — Inpatient Hospital Stay (HOSPITAL_COMMUNITY)
Admission: AD | Admit: 2021-11-06 | Discharge: 2021-11-14 | DRG: 885 | Disposition: A | Discharge status: Home / Self Care | Payer: Medicaid - Out of State | Source: Intra-hospital | Attending: Psychiatry | Admitting: Psychiatry

## 2021-11-06 ENCOUNTER — Other Ambulatory Visit: Payer: Self-pay

## 2021-11-06 DIAGNOSIS — F4310 Post-traumatic stress disorder, unspecified: Secondary | ICD-10-CM | POA: Diagnosis present

## 2021-11-06 DIAGNOSIS — F332 Major depressive disorder, recurrent severe without psychotic features: Principal | ICD-10-CM | POA: Diagnosis present

## 2021-11-06 DIAGNOSIS — F1210 Cannabis abuse, uncomplicated: Secondary | ICD-10-CM | POA: Diagnosis present

## 2021-11-06 DIAGNOSIS — R4587 Impulsiveness: Secondary | ICD-10-CM | POA: Diagnosis present

## 2021-11-06 DIAGNOSIS — Z79899 Other long term (current) drug therapy: Secondary | ICD-10-CM | POA: Diagnosis not present

## 2021-11-06 DIAGNOSIS — Z87891 Personal history of nicotine dependence: Secondary | ICD-10-CM

## 2021-11-06 DIAGNOSIS — F411 Generalized anxiety disorder: Secondary | ICD-10-CM | POA: Diagnosis present

## 2021-11-06 DIAGNOSIS — Z91199 Patient's noncompliance with other medical treatment and regimen due to unspecified reason: Secondary | ICD-10-CM

## 2021-11-06 DIAGNOSIS — Z818 Family history of other mental and behavioral disorders: Secondary | ICD-10-CM | POA: Diagnosis not present

## 2021-11-06 DIAGNOSIS — Z560 Unemployment, unspecified: Secondary | ICD-10-CM | POA: Diagnosis not present

## 2021-11-06 DIAGNOSIS — R45851 Suicidal ideations: Secondary | ICD-10-CM | POA: Diagnosis present

## 2021-11-06 DIAGNOSIS — R45850 Homicidal ideations: Secondary | ICD-10-CM | POA: Diagnosis present

## 2021-11-06 DIAGNOSIS — Z9151 Personal history of suicidal behavior: Secondary | ICD-10-CM

## 2021-11-06 DIAGNOSIS — Z20822 Contact with and (suspected) exposure to covid-19: Secondary | ICD-10-CM | POA: Diagnosis present

## 2021-11-06 DIAGNOSIS — G4700 Insomnia, unspecified: Secondary | ICD-10-CM | POA: Diagnosis present

## 2021-11-06 DIAGNOSIS — Z91410 Personal history of adult physical and sexual abuse: Secondary | ICD-10-CM

## 2021-11-06 DIAGNOSIS — F17200 Nicotine dependence, unspecified, uncomplicated: Secondary | ICD-10-CM | POA: Diagnosis present

## 2021-11-06 LAB — RESP PANEL BY RT-PCR (FLU A&B, COVID) ARPGX2
Influenza A by PCR: NEGATIVE
Influenza B by PCR: NEGATIVE
SARS Coronavirus 2 by RT PCR: NEGATIVE

## 2021-11-06 MED ORDER — NICOTINE 21 MG/24HR TD PT24
21.0000 mg | MEDICATED_PATCH | Freq: Once | TRANSDERMAL | Status: DC
Start: 2021-11-06 — End: 2021-11-06
  Administered 2021-11-06: 21 mg via TRANSDERMAL
  Filled 2021-11-06: qty 1

## 2021-11-06 MED ORDER — ALUM & MAG HYDROXIDE-SIMETH 200-200-20 MG/5ML PO SUSP: 30.0000 mL | ORAL | Status: DC | PRN

## 2021-11-06 MED ORDER — HYDROXYZINE HCL 25 MG PO TABS
25.0000 mg | ORAL_TABLET | Freq: Three times a day (TID) | ORAL | Status: DC | PRN
  Administered 2021-11-06 – 2021-11-09 (×6): 25 mg via ORAL
  Filled 2021-11-06 (×6): qty 1

## 2021-11-06 MED ORDER — ACETAMINOPHEN 325 MG PO TABS
650.0000 mg | ORAL_TABLET | Freq: Four times a day (QID) | ORAL | Status: DC | PRN
  Administered 2021-11-11: 650 mg via ORAL
  Filled 2021-11-06: qty 2

## 2021-11-06 MED ORDER — ENSURE ENLIVE PO LIQD
237.0000 mL | Freq: Two times a day (BID) | ORAL | Status: DC
  Administered 2021-11-07 – 2021-11-14 (×15): 237 mL via ORAL
  Filled 2021-11-06 (×19): qty 237

## 2021-11-06 MED ORDER — TRAZODONE HCL 50 MG PO TABS
50.0000 mg | ORAL_TABLET | Freq: Every evening | ORAL | Status: DC | PRN
  Administered 2021-11-06 – 2021-11-13 (×8): 50 mg via ORAL
  Filled 2021-11-06: qty 1
  Filled 2021-11-06: qty 7
  Filled 2021-11-06 (×7): qty 1

## 2021-11-06 MED ORDER — MAGNESIUM HYDROXIDE 400 MG/5ML PO SUSP
30.0000 mL | Freq: Every day | ORAL | Status: DC | PRN
  Administered 2021-11-12 – 2021-11-13 (×2): 30 mL via ORAL
  Filled 2021-11-06 (×2): qty 30

## 2021-11-06 NOTE — Plan of Care (Signed)
Patient is newly admitted and expressing motivation for treatment. Pleasant and cooperative and able to express his needs and feelings. He is alert and oriented x4. Continues to feel hopeless and  emotionally disturbed. Continues to endorse some suicidal thoughts but contracts for safety. Emotional support provided. Safety precautions reinforced.

## 2021-11-06 NOTE — Tx Team (Signed)
Initial Treatment Plan 11/06/2021 11:33 PM Strother A Bultema [id number]    PATIENT STRESSORS: Legal issue   Marital or family conflict   Medication change or noncompliance     PATIENT STRENGTHS: Ability for insight  Average or above average intelligence  Communication skills    PATIENT IDENTIFIED PROBLEMS: Emotional disturbances  Suicidal ideations  Relationship instability  Legal isues               DISCHARGE CRITERIA:  Ability to meet basic life and health needs Improved stabilization in mood, thinking, and/or behavior Motivation to continue treatment in a less acute level of care Verbal commitment to aftercare and medication compliance  PRELIMINARY DISCHARGE PLAN: Outpatient therapy Participate in family therapy Return to previous living arrangement Return to previous work or school arrangements  PATIENT/FAMILY INVOLVEMENT: This treatment plan has been presented to and reviewed with the patient, Chad Odonnell.  The patient has been given the opportunity to ask questions and make suggestions.  Olin Pia, RN 11/06/2021, 11:33 PM

## 2021-11-06 NOTE — Progress Notes (Signed)
Patient ID: Chad Odonnell, male   DOB: 01/07/2001, 21 y.o.   MRN: [id number]  Patient presents involuntarily secondary to increased suicidal/homicidal thoughts. Reported that yesterday he wanted to harm his "baby mama" but now denying. He is actively experiencing suicidal thoughts following a relationship crisis between him and his baby mama. Reported that he was legally forced to only see his 77 year-old daughter only one hour a month "and that's not fair". Patient presents with a pleasant attitude though he admits that he was not very cooperative yesterday.  He reports history of depression and GAD and was once put on Zoloft but did not keep up with it. He endorses hopelessness, helplessness, anxiety and "I am disappointed with my choices in life, I thought I would be better but...its getting worse". His brother committed suicide  8 months ago and patient has not been feeling alright since: I miss him". He reports hx of anger and aggressive behaviors and this issue was never addressed when he was growing up.  Patient reports that he has been experiencing difficulty sleeping and staying focused and his appetite is poor: has not been eating in a couple of days "because I don't feel like eating anything". Admits to use of THC and last use was a week ago. Drinks alcohol occasionally: reports that the last time he drank was around Christmas time when his brother committed suicide. He reports that his physical health is otherwise good though he has no current PCP. His mother is the primary support but patient does not want her to know "all about this". He is motivated for treatment to work on self-love, better self-care for his daughter and learning how to adapt to change. Patient was oriented to the unit and safety precautions were initiated. Food was offered. Patient was encouraged to communicate with staff as needed. To be evaluated by attending provider in AM.

## 2021-11-06 NOTE — ED Provider Notes (Signed)
Emergency Medicine Observation Re-evaluation Note  Chad Odonnell is a 21 y.o. male, seen on rounds today.  Pt initially presented to the ED for complaints of Suicidal and Homicidal Currently, the patient is resting.  Physical Exam  BP 111/72 (BP Location: Right Arm)   Pulse 65   Temp 97.8 F (36.6 C)   Resp 18   Ht 5\' 9"  (1.753 m)   Wt 58.6 kg   SpO2 100%   BMI 19.08 kg/m  Physical Exam General: calm Cardiac: RRR Lungs: no increased WOB Psych: calm  ED Course / MDM  EKG:   I have reviewed the labs performed to date as well as medications administered while in observation.  Recent changes in the last 24 hours include one outburst which he apologized for later.  Plan  Current plan is for admission to [facility]. Chad Odonnell is under involuntary commitment.      [facility], MD 11/06/21 [phone]

## 2021-11-06 NOTE — ED Notes (Signed)
Pt has been refusing meals since he arrived yesterday.

## 2021-11-06 NOTE — ED Notes (Signed)
Pt woke up and informed sitter that he does not want mother to be allowed back into ED to visit. Sitter told pt they would inform pt nurse of request and pt began walking toward charge desk- sitter asking pt where he was going to which pt said "to find that lady" redirected pt back to bed, pt compliant but agitated stating "you need me to come back to the bed. Of fucking course you do."

## 2021-11-06 NOTE — Progress Notes (Signed)
Per Maxie Barb, NP, patient meets criteria for inpatient treatment. There are no available beds at [facility] today. CSW faxed referrals to the following facilities for review:  [facility]  Pending - No Request Sent N/A [address], [place] [address] [phone] --  [facility]  Pending - No Request Sent N/A [address], [place] [address] [phone] --  [facility]  Pending - No Request Sent N/A [address] [place] [address] [phone] --  [facility]  Pending - No Request Sent N/A [address] [place] [address] [phone] --  [facility]  Pending - No Request Sent N/A [address] [place] [address] [phone] --  [facility]  Pending - No Request Sent N/A [address] [place] [address] [phone] --  [facility]  Pending - No Request Sent N/A [address] [place] [address] [phone] --  [facility]  Pending - No Request Sent N/A [address] [place] [address] [phone] --  [facility]  Pending - No Request Sent N/A [address] [place] [address] [phone] --  [facility]  Pending - No Request Sent N/A [address], [place] [address] [phone] --  [facility]  Pending - No Request Sent N/A [address] [place] [address] [phone] --  [facility]  Pending - No Request Sent N/A [address] [place] [address] [phone] --  [facility]  Pending - No Request Sent N/A [address], [place] [address] [phone] --  [facility]   Pending - No Request Sent N/A [address], [place] [address] [phone] --   TTS will continue to seek bed placement.  Crissie Reese, MSW, Lenice Pressman Phone: [phone] Disposition/TOC

## 2021-11-07 ENCOUNTER — Encounter (HOSPITAL_COMMUNITY): Payer: Self-pay

## 2021-11-07 ENCOUNTER — Encounter (HOSPITAL_COMMUNITY): Payer: Self-pay | Admitting: Psychiatry

## 2021-11-07 DIAGNOSIS — F1210 Cannabis abuse, uncomplicated: Secondary | ICD-10-CM

## 2021-11-07 DIAGNOSIS — F17200 Nicotine dependence, unspecified, uncomplicated: Secondary | ICD-10-CM | POA: Diagnosis present

## 2021-11-07 DIAGNOSIS — F4310 Post-traumatic stress disorder, unspecified: Secondary | ICD-10-CM

## 2021-11-07 DIAGNOSIS — F332 Major depressive disorder, recurrent severe without psychotic features: Principal | ICD-10-CM

## 2021-11-07 DIAGNOSIS — F411 Generalized anxiety disorder: Secondary | ICD-10-CM | POA: Diagnosis present

## 2021-11-07 LAB — LIPID PANEL
Cholesterol: 139 mg/dL (ref 0–200)
HDL: 33 mg/dL — ABNORMAL LOW (ref >40)
LDL Cholesterol: 92 mg/dL (ref 0–99)
Total CHOL/HDL Ratio: 4.2 RATIO
Triglycerides: 71 mg/dL (ref <150)
VLDL: 14 mg/dL (ref 0–40)

## 2021-11-07 LAB — CBC
HCT: 45.6 % (ref 39.0–52.0)
Hemoglobin: 15.2 g/dL (ref 13.0–17.0)
MCH: 31 pg (ref 26.0–34.0)
MCHC: 33.3 g/dL (ref 30.0–36.0)
MCV: 92.9 fL (ref 80.0–100.0)
Platelets: 277 10*3/uL (ref 150–400)
RBC: 4.91 MIL/uL (ref 4.22–5.81)
RDW: 11.8 % (ref 11.5–15.5)
WBC: 7.7 10*3/uL (ref 4.0–10.5)
nRBC: 0 % (ref 0.0–0.2)

## 2021-11-07 LAB — COMPREHENSIVE METABOLIC PANEL
ALT: 8 U/L (ref 0–44)
AST: 16 U/L (ref 15–41)
Albumin: 4.4 g/dL (ref 3.5–5.0)
Alkaline Phosphatase: 50 U/L (ref 38–126)
Anion gap: 12 (ref 5–15)
BUN: 20 mg/dL (ref 6–20)
CO2: 22 mmol/L (ref 22–32)
Calcium: 9.6 mg/dL (ref 8.9–10.3)
Chloride: 105 mmol/L (ref 98–111)
Creatinine, Ser: 1.29 mg/dL — ABNORMAL HIGH (ref 0.61–1.24)
GFR, Estimated: >60 mL/min (ref >60)
Glucose, Bld: 107 mg/dL — ABNORMAL HIGH (ref 70–99)
Potassium: 3.8 mmol/L (ref 3.5–5.1)
Sodium: 139 mmol/L (ref 135–145)
Total Bilirubin: 1.4 mg/dL — ABNORMAL HIGH (ref 0.3–1.2)
Total Protein: 7.4 g/dL (ref 6.5–8.1)

## 2021-11-07 LAB — URINALYSIS, COMPLETE (UACMP) WITH MICROSCOPIC
Bacteria, UA: NONE SEEN
Bilirubin Urine: NEGATIVE
Glucose, UA: NEGATIVE mg/dL
Hgb urine dipstick: NEGATIVE
Ketones, ur: NEGATIVE mg/dL
Nitrite: NEGATIVE
Protein, ur: NEGATIVE mg/dL
Specific Gravity, Urine: 1.008 (ref 1.005–1.030)
WBC, UA: >50 WBC/hpf — ABNORMAL HIGH (ref 0–5)
pH: 7 (ref 5.0–8.0)

## 2021-11-07 LAB — RAPID URINE DRUG SCREEN, HOSP PERFORMED
Amphetamines: NOT DETECTED
Barbiturates: NOT DETECTED
Benzodiazepines: NOT DETECTED
Cocaine: NOT DETECTED
Opiates: NOT DETECTED
Tetrahydrocannabinol: POSITIVE — AB

## 2021-11-07 LAB — HEMOGLOBIN A1C
Hgb A1c MFr Bld: 4.5 % — ABNORMAL LOW (ref 4.8–5.6)
Mean Plasma Glucose: 82.45 mg/dL

## 2021-11-07 LAB — TSH: TSH: 0.637 u[IU]/mL (ref 0.350–4.500)

## 2021-11-07 MED ORDER — ADULT MULTIVITAMIN W/MINERALS CH
1.0000 | ORAL_TABLET | Freq: Every day | ORAL | Status: DC
  Administered 2021-11-07 – 2021-11-14 (×8): 1 via ORAL
  Filled 2021-11-07 (×10): qty 1

## 2021-11-07 MED ORDER — OLANZAPINE 5 MG PO TBDP
5.0000 mg | ORAL_TABLET | Freq: Three times a day (TID) | ORAL | Status: DC | PRN
  Administered 2021-11-07 – 2021-11-09 (×3): 5 mg via ORAL
  Filled 2021-11-07 (×5): qty 1

## 2021-11-07 MED ORDER — VENLAFAXINE HCL ER 37.5 MG PO CP24
37.5000 mg | ORAL_CAPSULE | Freq: Every day | ORAL | Status: AC
  Administered 2021-11-07: 37.5 mg via ORAL
  Filled 2021-11-07: qty 1

## 2021-11-07 MED ORDER — LORAZEPAM 1 MG PO TABS: 1.0000 mg | ORAL_TABLET | ORAL | Status: DC | PRN

## 2021-11-07 MED ORDER — OLANZAPINE 10 MG IM SOLR
5.0000 mg | Freq: Three times a day (TID) | INTRAMUSCULAR | Status: DC | PRN
Start: 2021-11-07 — End: 2021-11-14

## 2021-11-07 MED ORDER — VENLAFAXINE HCL ER 75 MG PO CP24
75.0000 mg | ORAL_CAPSULE | Freq: Every day | ORAL | Status: DC
  Administered 2021-11-08 – 2021-11-10 (×3): 75 mg via ORAL
  Filled 2021-11-07 (×5): qty 1

## 2021-11-07 MED ORDER — NICOTINE 14 MG/24HR TD PT24
14.0000 mg | MEDICATED_PATCH | Freq: Every day | TRANSDERMAL | Status: DC
  Administered 2021-11-07 – 2021-11-14 (×8): 14 mg via TRANSDERMAL
  Filled 2021-11-07 (×11): qty 1

## 2021-11-07 MED ORDER — LORAZEPAM 1 MG PO TABS
1.0000 mg | ORAL_TABLET | Freq: Three times a day (TID) | ORAL | Status: DC | PRN
  Administered 2021-11-07 – 2021-11-13 (×4): 1 mg via ORAL
  Filled 2021-11-07 (×4): qty 1

## 2021-11-07 MED ORDER — MIRTAZAPINE 15 MG PO TABS
15.0000 mg | ORAL_TABLET | Freq: Every day | ORAL | Status: DC
  Administered 2021-11-07 – 2021-11-13 (×7): 15 mg via ORAL
  Filled 2021-11-07 (×10): qty 1

## 2021-11-07 MED ORDER — ZIPRASIDONE MESYLATE 20 MG IM SOLR: 20.0000 mg | Freq: Two times a day (BID) | INTRAMUSCULAR | Status: DC | PRN
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
## 2021-11-07 NOTE — BH IP Treatment Plan (Signed)
Interdisciplinary Treatment and Diagnostic Plan Update  11/07/2021 Time of Session: 9:35am Chad Odonnell MRN: [id number]  Principal Diagnosis: MDD (major depressive disorder), recurrent severe, without psychosis ([place])  Secondary Diagnoses: Principal Problem:   MDD (major depressive disorder), recurrent severe, without psychosis ([place])   Current Medications:  Current Facility-Administered Medications  Medication Dose Route Frequency Provider Last Rate Last Admin   acetaminophen (TYLENOL) tablet 650 mg  650 mg Oral Q6H PRN Ntuen, Kris Hartmann, FNP       alum & mag hydroxide-simeth (MAALOX/MYLANTA) 200-200-20 MG/5ML suspension 30 mL  30 mL Oral Q4H PRN Ntuen, Kris Hartmann, FNP       feeding supplement (ENSURE ENLIVE / ENSURE PLUS) liquid 237 mL  237 mL Oral BID BM Massengill, Nathan, MD   237 mL at 11/07/21 0945   hydrOXYzine (ATARAX) tablet 25 mg  25 mg Oral TID PRN Laretta Bolster, FNP   25 mg at 11/07/21 0748   magnesium hydroxide (MILK OF MAGNESIA) suspension 30 mL  30 mL Oral Daily PRN Ntuen, Kris Hartmann, FNP       multivitamin with minerals tablet 1 tablet  1 tablet Oral Daily Massengill, Nathan, MD       nicotine (NICODERM CQ - dosed in mg/24 hours) patch 14 mg  14 mg Transdermal Daily Massengill, Ovid Curd, MD   14 mg at 11/07/21 0748   traZODone (DESYREL) tablet 50 mg  50 mg Oral QHS PRN Laretta Bolster, FNP   50 mg at 11/06/21 2308   PTA Medications: No medications prior to admission.    Patient Stressors: Legal issue   Marital or family conflict   Medication change or noncompliance    Patient Strengths: Ability for insight  Average or above average intelligence  Communication skills   Treatment Modalities: Medication Management, Group therapy, Case management,  1 to 1 session with clinician, Psychoeducation, Recreational therapy.   Physician Treatment Plan for Primary Diagnosis: MDD (major depressive disorder), recurrent severe, without psychosis ([place]) Long Term Goal(s):     Short Term  Goals:    Medication Management: Evaluate patient's response, side effects, and tolerance of medication regimen.  Therapeutic Interventions: 1 to 1 sessions, Unit Group sessions and Medication administration.  Evaluation of Outcomes: Not Met  Physician Treatment Plan for Secondary Diagnosis: Principal Problem:   MDD (major depressive disorder), recurrent severe, without psychosis ([place])  Long Term Goal(s):     Short Term Goals:       Medication Management: Evaluate patient's response, side effects, and tolerance of medication regimen.  Therapeutic Interventions: 1 to 1 sessions, Unit Group sessions and Medication administration.  Evaluation of Outcomes: Not Met   RN Treatment Plan for Primary Diagnosis: MDD (major depressive disorder), recurrent severe, without psychosis ([place]) Long Term Goal(s): Knowledge of disease and therapeutic regimen to maintain health will improve  Short Term Goals: Ability to remain free from injury will improve, Ability to verbalize frustration and anger appropriately will improve, Ability to demonstrate self-control, Ability to participate in decision making will improve, Ability to verbalize feelings will improve, Ability to disclose and discuss suicidal ideas, Ability to identify and develop effective coping behaviors will improve, and Compliance with prescribed medications will improve  Medication Management: RN will administer medications as ordered by provider, will assess and evaluate patient's response and provide education to patient for prescribed medication. RN will report any adverse and/or side effects to prescribing provider.  Therapeutic Interventions: 1 on 1 counseling sessions, Psychoeducation, Medication administration, Evaluate responses to treatment, Monitor vital  signs and CBGs as ordered, Perform/monitor CIWA, COWS, AIMS and Fall Risk screenings as ordered, Perform wound care treatments as ordered.  Evaluation of Outcomes: Not  Met   LCSW Treatment Plan for Primary Diagnosis: MDD (major depressive disorder), recurrent severe, without psychosis ([place]) Long Term Goal(s): Safe transition to appropriate next level of care at discharge, Engage patient in therapeutic group addressing interpersonal concerns.  Short Term Goals: Engage patient in aftercare planning with referrals and resources, Increase social support, Increase ability to appropriately verbalize feelings, Increase emotional regulation, Facilitate acceptance of mental health diagnosis and concerns, Facilitate patient progression through stages of change regarding substance use diagnoses and concerns, Identify triggers associated with mental health/substance abuse issues, and Increase skills for wellness and recovery  Therapeutic Interventions: Assess for all discharge needs, 1 to 1 time with Social worker, Explore available resources and support systems, Assess for adequacy in community support network, Educate family and significant other(s) on suicide prevention, Complete Psychosocial Assessment, Interpersonal group therapy.  Evaluation of Outcomes: Not Met   Progress in Treatment: Attending groups: Yes. Participating in groups: Yes. Taking medication as prescribed: Yes. Toleration medication: Yes. Family/Significant other contact made: No, will contact:  CSW will assess and identify support person. Patient understands diagnosis: Yes. Discussing patient identified problems/goals with staff: Yes. Medical problems stabilized or resolved: Yes. Denies suicidal/homicidal ideation: Yes. Issues/concerns per patient self-inventory: No.  New problem(s) identified: No, Describe:  none reported  New Short Term/Long Term Goal(s):   medication stabilization, elimination of SI thoughts, development of comprehensive mental wellness plan.    Patient Goals:  Patient states, "to be in a better mindset than I am in now"  Discharge Plan or Barriers: Patient recently  admitted. CSW will continue to follow and assess for appropriate referrals and possible discharge planning.    Reason for Continuation of Hospitalization: Anxiety Depression Homicidal ideation Medication stabilization Suicidal ideation  Estimated Length of Stay:  Last 3 [place] Suicide Severity Risk Score: Flowsheet Row Admission (Current) from 11/06/2021 in [facility] ED from 11/05/2021 in [facility] High Risk High Risk       Last PHQ 2/9 Scores:     No data to display          Scribe for Treatment Team: Zachery Conch, LCSW 11/07/2021 11:52 AM

## 2021-11-07 NOTE — B[facility] Group Notes (Signed)
Spiritual care group on grief and loss facilitated by chaplain Dyanne Carrel, [facility]   Group Goal:   Support / Education around grief and loss   Members engage in facilitated group support and psycho-social education.   Group Description:   Following introductions and group rules, group members engaged in facilitated group dialog and support around topic of loss, with particular support around experiences of loss in their lives. Group Identified types of loss (relationships / self / things) and identified patterns, circumstances, and changes that precipitate losses. Reflected on thoughts / feelings around loss, normalized grief responses, and recognized variety in grief experience. Group noted Worden's four tasks of grief in discussion.   Group drew on Adlerian / Rogerian, narrative, MI,   Patient Progress: Chad Odonnell attended group and engaged in the group conversation.  He was very tired after arriving late in the night, but he shared about some of his coping tools.  [address] [facility] Pager, [phone]

## 2021-11-07 NOTE — Group Note (Signed)
LCSW Group Therapy Note   Group Date: 11/07/2021 Start Time: 1300 End Time: 1400  Type of Therapy and Topic:  Group Therapy - Who Am I?  Participation Level:  Active   Description of Group The focus of this group was to aid patients in self-exploration and awareness. Patients were guided in exploring various factors of oneself to include interests, readiness to change, management of emotions, and individual perception of self. Patients were provided with complementary worksheets exploring hidden talents, ease of asking other for help, music/media preferences, understanding and responding to feelings/emotions, and hope for the future. At group closing, patients were encouraged to adhere to discharge plan to assist in continued self-exploration and understanding.  Therapeutic Goals Patients learned that self-exploration and awareness is an ongoing process Patients identified their individual skills, preferences, and abilities Patients explored their openness to establish and confide in supports Patients explored their readiness for change and progression of mental health   Summary of Patient Progress:  Patient actively engaged in introductory check-in. Patient actively engaged in activity of self-exploration and identification,  completing complementary worksheet to assist in discussion. Patient identified various factors ranging from hidden talents, favorite music and movies, trusted individuals, accountability, and individual perceptions of self and hope. Pt engaged in processing thoughts and feelings as well as means of reframing thoughts. Pt proved receptive of alternate group members input and feedback from CSW.   Therapeutic Modalities Cognitive Behavioral Therapy Motivational Interviewing  [name] 11/07/2021  3:46 PM

## 2021-11-07 NOTE — Progress Notes (Signed)
NUTRITION ASSESSMENT  Pt identified as at risk on the Malnutrition Screen Tool  INTERVENTION: 1. Supplements: Ensure Plus High Protein po BID, each supplement provides 350 kcal and 20 grams of protein.   NUTRITION DIAGNOSIS: Unintentional weight loss related to sub-optimal intake as evidenced by pt report.   Goal: Pt to meet >/= 90% of their estimated nutrition needs.  Monitor:  PO intake  Assessment:  Pt admitted for SI and HI. Pt reports he has not been eating well recently with poor appetite.  Weight is down since 2020 per weight records. Ensure supplements have been ordered. Will add daily MVI.   Height: Ht Readings from Last 1 Encounters:  11/06/21 5\' 9"  (1.753 m)    Weight: Wt Readings from Last 1 Encounters:  11/06/21 58.5 kg    Weight Hx: Wt Readings from Last 10 Encounters:  11/06/21 58.5 kg  11/05/21 58.6 kg  01/09/19 68 kg  07/24/18 68 kg (42 %, Z= -0.21)*  12/16/13 63.5 kg (71 %, Z= 0.56)*  10/17/13 63.5 kg (73 %, Z= 0.63)*  10/08/13 63.5 kg (74 %, Z= 0.64)*   * Growth percentiles are based on CDC (Boys, 2-20 Years) data.    BMI:  Body mass index is 19.05 kg/m. Pt meets criteria for normal based on current BMI.  Estimated Nutritional Needs: Kcal: 25-30 kcal/kg Protein: > 1 gram protein/kg Fluid: 1 ml/kcal  Diet Order:  Diet Order             Diet regular Room service appropriate? Yes; Fluid consistency: Thin  Diet effective now                  Pt is also offered choice of unit snacks mid-morning and mid-afternoon.  Pt is eating as desired.   Lab results and medications reviewed.   10/10/13, MS, RD, LDN Inpatient Clinical Dietitian Contact information available via Amion

## 2021-11-07 NOTE — Group Note (Signed)
Occupational Therapy Group Note   Group Topic:Goal Setting  Group Date: 11/07/2021 Start Time: 1400 End Time: 1445 Facilitators: Ted Mcalpine, OT   Group Description: Group encouraged engagement and participation through discussion focused on goal setting. Group members were introduced to goal-setting using the SMART Goal framework, identifying goals as Specific, Measureable, Acheivable, Relevant, and Time-Bound. Group members took time from group to create their own personal goal reflecting the SMART goal template and shared for review by peers and OT.    Therapeutic Goal(s):  Identify at least one goal that fits the SMART framework    Participation Level: Active and Engaged   Participation Quality: Independent   Behavior: Appropriate   Speech/Thought Process: Coherent and Directed   Affect/Mood: Appropriate   Insight: Good   Judgement: Good   Individualization: pt was engaged in their participation of group discussion/activity. New skills identified  Modes of Intervention: Discussion and Education  Patient Response to Interventions:  Attentive and Engaged   Plan: Continue to engage patient in OT groups 2 - 3x/week.  11/07/2021  Ted Mcalpine, OT Kerrin Champagne, OT
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
## 2021-11-07 NOTE — Group Note (Signed)
Recreation Therapy Group Note   Group Topic:Stress Management  Group Date: 11/07/2021 Start Time: 0930 End Time: 0945 Facilitators: Caroll Rancher, [place] Location: [address] Dayroom   Goal Area(s) Addresses:  Patient will identify positive stress management techniques. Patient will identify benefits of using stress management post d/c.  Group Description:  Meditation.  LRT played patients a meditation that focused on being grounded, grateful and intentional.  Patients were to follow along practicing their breathing and allowing it to relax them while visualizing the positive intentions they wish to have throughout the day.    Affect/Mood: Flat   Participation Level: Moderate   Participation Quality: Independent   Behavior: Appropriate   Speech/Thought Process: Focused   Insight: Good   Judgement: Good   Modes of Intervention: Ambient Music   Patient Response to Interventions:  Attentive   Education Outcome:  Acknowledges education and In group clarification offered    Clinical Observations/Individualized Feedback: Pt attended and gave moderate participation during group session.     Plan: Continue to engage patient in RT group sessions 2-3x/week.   Caroll Rancher, LRT,CTRS 11/07/2021 11:28 AM

## 2021-11-07 NOTE — Progress Notes (Signed)
Adult Psychoeducational Group Note  Date:  11/07/2021 Time:  8:32 PM  Group Topic/Focus:  Wrap-Up Group:   The focus of this group is to help patients review their daily goal of treatment and discuss progress on daily workbooks.  Participation Level:  Active  Participation Quality:  Appropriate  Affect:  Appropriate  Cognitive:  Appropriate  Insight: Appropriate  Engagement in Group:  Engaged  Modes of Intervention:  Discussion  Additional Comments:  patient attend wrap up group  Charna Busman Long 11/07/2021, 8:32 PM

## 2021-11-08 NOTE — Group Note (Signed)
Recreation Therapy Group Note   Group Topic:Animal Assisted Therapy   Group Date: 11/08/2021 Start Time: 1425 End Time: 1500 Facilitators: Caroll Rancher, LRT,CTRS Location: [address] Dayroom   Animal-Assisted Activity (AAA) Program Checklist/Progress Notes Patient Eligibility Criteria Checklist & Daily Group note for Rec Tx Intervention   AAA/T Program Assumption of Risk Form signed by Patient/ or Parent Legal Guardian Yes  Patient is free of allergies or severe asthma Yes  Patient reports no fear of animals Yes  Patient reports no history of cruelty to animals Yes  Patient understands his/her participation is voluntary Yes  Patient washes hands before animal contact Yes  Patient washes hands after animal contact Yes   Affect/Mood: Flat   Participation Level: Moderate   Participation Quality: Independent   Behavior: Appropriate   Patient Response to Interventions:  Attentive   Education Outcome:  Acknowledges education and In group clarification offered    Clinical Observations/Individualized Feedback:  Pt attended and participated in group session.  Pt asked appropriate questions and interacted well with the dog team.      Plan: Continue to engage patient in RT group sessions 2-3x/week.   Caroll Rancher, LRT,CTRS 11/08/2021 3:20 PM
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
## 2021-11-08 NOTE — Progress Notes (Signed)
Pt on unit and attended groups today. Pt compliant with medication. Pt perseverating about getting angry yesterday and "going off" on staff. Pt received hydroxyzine for anxiety with minimal effect. Pt acknowledges holding onto negative thoughts. Insight is poor. Affect is irritable. Pt states "You all will decide when I go home based off what I tell ya'll so Im just going to pretend everything's great." Pt denies suicidal and homicidal thoughts; endorses depressive symptoms 8/10. Q15 minute checks ongoing for safety.

## 2021-11-08 NOTE — [facility] Group Notes (Signed)
Patient did attend group. 

## 2021-11-08 NOTE — Plan of Care (Signed)
  Problem: Education: Goal: Ability to state activities that reduce stress will improve Outcome: Not Progressing   Problem: Self-Concept: Goal: Level of anxiety will decrease Outcome: Not Progressing   Problem: Education: Goal: Utilization of techniques to improve thought processes will improve Outcome: Not Progressing   

## 2021-11-08 NOTE — Plan of Care (Signed)
  Problem: Education: Goal: Ability to state activities that reduce stress will improve Outcome: Not Progressing   Problem: Self-Concept: Goal: Level of anxiety will decrease Outcome: Not Progressing   Problem: Education: Goal: Utilization of techniques to improve thought processes will improve Outcome: Not Progressing
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
## 2021-11-09 MED ORDER — GABAPENTIN 100 MG PO CAPS
100.0000 mg | ORAL_CAPSULE | Freq: Three times a day (TID) | ORAL | Status: DC
  Administered 2021-11-09 – 2021-11-10 (×2): 100 mg via ORAL
  Filled 2021-11-09 (×5): qty 1

## 2021-11-09 MED ORDER — OLANZAPINE 5 MG PO TBDP
5.0000 mg | ORAL_TABLET | Freq: Three times a day (TID) | ORAL | Status: DC
  Administered 2021-11-09 – 2021-11-14 (×15): 5 mg via ORAL
  Filled 2021-11-09 (×23): qty 1

## 2021-11-09 MED ORDER — HYDROXYZINE HCL 25 MG PO TABS
25.0000 mg | ORAL_TABLET | ORAL | Status: DC | PRN
Start: 2021-11-09 — End: 2021-11-14
  Administered 2021-11-09 – 2021-11-14 (×18): 25 mg via ORAL
  Filled 2021-11-09 (×20): qty 1

## 2021-11-09 NOTE — Group Note (Signed)
Recreation Therapy Group Note   Group Topic:Team Building  Group Date: 11/09/2021 Start Time: 0940 End Time: 1000 Facilitators: Caroll Rancher, LRT,CTRS Location: [address] Hall Dayroom   Goal Area(s) Addresses:  Patient will effectively work with peer towards shared goal.  Patient will identify skills used to make activity successful.  Patient will identify how skills used during activity can be applied to reach post d/c goals.   Group Description: [employer]. In teams of 5-6, patients were given 12 craft pipe cleaners. Using the materials provided, patients were instructed to compete again the opposing team(s) to build the tallest free-standing structure from floor level. The activity was timed; difficulty increased by Clinical research associate as Production designer, theatre/television/film continued.  Systematically resources were removed with additional directions for example, placing one arm behind their back, working in silence, and shape stipulations. LRT facilitated post-activity discussion reviewing team processes and necessary communication skills involved in completion. Patients were encouraged to reflect how the skills utilized, or not utilized, in this activity can be incorporated to positively impact support systems post discharge.   Affect/Mood: Appropriate   Participation Level: Engaged   Participation Quality: Independent   Behavior: Appropriate   Speech/Thought Process: Focused   Insight: Good   Judgement: Good   Modes of Intervention: Team-building   Patient Response to Interventions:  Engaged   Education Outcome:  Acknowledges education and In group clarification offered    Clinical Observations/Individualized Feedback: Pt attended and participated in group session.  Pt expressed only being able to use one hand was hard and it showed how each person in your support system has to contribute to make the plan work.  Pt explained it also showed each person has a role to play.    Plan:  Continue to engage patient in RT group sessions 2-3x/week.   Caroll Rancher, Antonietta Jewel  11/09/2021 12:18 PM

## 2021-11-09 NOTE — [facility] Group Notes (Signed)
Adult Psychoeducational Group Note  Date:  11/09/2021 Time:  9:45 AM  Group Topic/Focus:  Goals Group:   The focus of this group is to help patients establish daily goals to achieve during treatment and discuss how the patient can incorporate goal setting into their daily lives to aide in recovery.  Participation Level:  Active  Participation Quality:  Appropriate  Affect:  Appropriate  Cognitive:  Appropriate  Insight: Appropriate  Engagement in Group:  Engaged  Modes of Intervention:  Education  [name] [name] 11/09/2021, 9:45 AM 

## 2021-11-09 NOTE — Group Note (Signed)
[facility] LCSW Group Therapy Note   Group Date: 11/09/2021 Start Time: 1300 End Time: 1400  Type of Therapy/Topic:  Group Therapy:  Feelings about Hospitalization  Participation Level:  Active   Description of Group:   This group will allow patients to explore their thoughts and feelings about hospitalization. Patients will be guided to explore their level of understanding and acceptance of being admitted. Facilitator will encourage patients to process their thoughts and feelings about the reactions of others, and will guide patients in identifying ways to discuss that this current hospital stay can benefit their mental health. This group will be process-oriented, with patients participating in exploration of their own experiences as well as giving and receiving support and challenge from other group members.   Therapeutic Goals: 1. Patient will demonstrate understanding of the positive and negative feelings surrounding hospitalization  2. Patient will be able to express two feelings regarding the hospitalization 3. Patient will demonstrate ability to communicate their needs through discussion and/or role plays  Summary of Patient Progress: Patient was able to provide their thoughts about being hospitalized.  Patient was active during group session.  Therapeutic Modalities:   Cognitive Behavioral Therapy Brief Therapy Feelings Identification  

## 2021-11-09 NOTE — Progress Notes (Signed)
D) Pt received calm, visible, participating in milieu, and in no acute distress. Pt A & O x4. Pt denies SI, HI, A/ V H, depression, anxiety and pain at this time. A) Pt encouraged to drink fluids. Pt encouraged to come to staff with needs. Pt encouraged to attend and participate in groups. Pt encouraged to set reachable goals.  R) Pt remained safe on unit, in no acute distress, will continue to assess.      11/09/21 1930  Psych Admission Type (Psych Patients Only)  Admission Status Involuntary  Psychosocial Assessment  Patient Complaints Anxiety  Eye Contact Fair  Facial Expression Anxious  Affect Anxious  Speech Logical/coherent  Interaction Assertive  Motor Activity Other (Comment) (unremarkable)  Appearance/Hygiene Unremarkable  Behavior Characteristics Cooperative  Mood Anxious  Thought Process  Coherency WDL  Content Blaming others  Delusions WDL  Perception WDL  Hallucination None reported or observed  Judgment Limited  Confusion WDL  Danger to Self  Current suicidal ideation? Denies  Self-Injurious Behavior No self-injurious ideation or behavior indicators observed or expressed   Agreement Not to Harm Self Yes  Description of Agreement verbal  Danger to Others  Danger to Others None reported or observed

## 2021-11-09 NOTE — Progress Notes (Addendum)
Pt on unit today. Pt reported feeling anxious this morning as well as endorsed passive suicidal and homicidal thoughts. Pt denies plan or intent. Pt received hydroxyzine prn in the morning. Physician notified of pts expressed concerns with medication timing and that he feels as if he is needing it more frequently. New orders noted and pt educated. Pt received second dose of hydroxyzine around lunch time with positive effect. Pts affect is calm at this time and pt is participating in group. Pt expressed feeling "not normal" having mental illness and is inquiring how long he will need to take medications. Nurse educated pt about increased risk of relapse of symptoms with non compliance of medication regimen and encouraged pt to discuss with physician before making changes to medications.  Pt verbalized understanding.  330pm pt returned from outside rec staff reported pt was aggressive with peers and argumentative with redirection. Pt agitated upon arrival to unit. Physician notified, new order noted. Pt received zyprexa prn with good effect.
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
## 2021-11-10 LAB — BASIC METABOLIC PANEL
Anion gap: 9 (ref 5–15)
BUN: 11 mg/dL (ref 6–20)
CO2: 26 mmol/L (ref 22–32)
Calcium: 9.8 mg/dL (ref 8.9–10.3)
Chloride: 106 mmol/L (ref 98–111)
Creatinine, Ser: 1.13 mg/dL (ref 0.61–1.24)
GFR, Estimated: >60 mL/min (ref >60)
Glucose, Bld: 96 mg/dL (ref 70–99)
Potassium: 3.9 mmol/L (ref 3.5–5.1)
Sodium: 141 mmol/L (ref 135–145)

## 2021-11-10 MED ORDER — GABAPENTIN 300 MG PO CAPS
300.0000 mg | ORAL_CAPSULE | Freq: Three times a day (TID) | ORAL | Status: DC
  Administered 2021-11-10 – 2021-11-14 (×13): 300 mg via ORAL
  Filled 2021-11-10 (×19): qty 1

## 2021-11-10 MED ORDER — PROPRANOLOL HCL 10 MG PO TABS
10.0000 mg | ORAL_TABLET | Freq: Two times a day (BID) | ORAL | Status: DC
  Administered 2021-11-10 – 2021-11-14 (×8): 10 mg via ORAL
  Filled 2021-11-10 (×14): qty 1

## 2021-11-10 MED ORDER — FLUOXETINE HCL 20 MG PO CAPS
20.0000 mg | ORAL_CAPSULE | Freq: Every day | ORAL | Status: DC
  Administered 2021-11-10 – 2021-11-14 (×5): 20 mg via ORAL
  Filled 2021-11-10 (×8): qty 1

## 2021-11-10 NOTE — [facility] Group Notes (Signed)
Pt did not attend nutrition group. 

## 2021-11-10 NOTE — Progress Notes (Signed)
Adult Psychoeducational Group Note  Date:  11/10/2021 Time:  10:43 PM  Group Topic/Focus:  Wrap-Up Group:   The focus of this group is to help patients review their daily goal of treatment and discuss progress on daily workbooks.  Participation Level:  Active  Participation Quality:  Appropriate  Affect:  Appropriate  Cognitive:  Appropriate  Insight: Appropriate  Engagement in Group:  Engaged  Modes of Intervention:  Discussion  Additional Comments:    Pt actively participated in the Wrap Up group/ Pt rated his day/mood 10/10. Pt reports no depression or anxiety today. Pt stated he made progress toward his goal of smiling throughout the day and socializing with his peers. Pt shared how treatment has helped him feel better each day and he feels like he can better manager his mood and symptoms. Pt plans to practice deep breathing, counting and writing daily to continue his progress with managing his mood and symptoms.  Chad Odonnell 11/10/2021, 10:43 PM
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
## 2021-11-10 NOTE — Progress Notes (Signed)
D:  Patient's self inventory sheet, patient sleeps good, sleep medication helpful.  Fair appetite, normal energy level, good concentration.  Denied depression, hopeless 3, anxiety 6.  Denied withdrawals.  Denied SI.  Then checked "sometimes".  Denied physical pain.  Goal is get meds on time.  Plans to do it myself.  "Ya'll don't know how to listen or even your job."  "Bitting my toung." A:  Medications administered per MD orders.  Emotional support and encouragement given patient. R:  Denied SI and HI, while talking to nurse today.  Denied A/V hallucinations.  Contracts for safety. Safety maintained with 15 minute checks.  Patient looking forward to discharge today.

## 2021-11-10 NOTE — [facility] Group Notes (Signed)
Adult Psychoeducational Group Note  Date:  11/10/2021 Time:  10:18 AM  Group Topic/Focus:  Goals Group:   The focus of this group is to help patients establish daily goals to achieve during treatment and discuss how the patient can incorporate goal setting into their daily lives to aide in recovery.  Participation Level:  Did Not Attend  Participation Quality:   Did not attend  Affect:   Did not attend  Cognitive:   Did not attend   Insight: None  Engagement in Group:   Did not attend  Modes of Intervention:   Did not attend  Additional Comments:  Pt did not get up to attend group.  Jazilyn Siegenthaler, Sharen Counter 11/10/2021, 10:18 AM

## 2021-11-10 NOTE — Progress Notes (Signed)
D) Pt received calm, visible, participating in milieu, and in no acute distress. Pt A & O x4. Pt denies SI, HI, A/ V H, depression, anxiety and pain at this time. A) Pt encouraged to drink fluids. Pt encouraged to come to staff with needs. Pt encouraged to attend and participate in groups. Pt encouraged to set reachable goals.  R) Pt remained safe on unit, in no acute distress, will continue to assess. Pt in great frame of mind tonight. Pt playful and complementary to peers.  Pt provided PRN medications for sleep.    11/10/21 2000  Psych Admission Type (Psych Patients Only)  Admission Status Involuntary  Psychosocial Assessment  Patient Complaints Anxiety  Eye Contact Fair  Facial Expression Animated  Affect Anxious  Speech Logical/coherent  Interaction Assertive  Motor Activity Other (Comment) (unremarkable)  Appearance/Hygiene Unremarkable  Behavior Characteristics Cooperative;Appropriate to situation  Mood Anxious  Thought Process  Coherency WDL  Content Blaming others  Delusions None reported or observed  Perception WDL  Hallucination None reported or observed  Judgment Limited  Confusion None  Danger to Self  Current suicidal ideation? Denies  Self-Injurious Behavior No self-injurious ideation or behavior indicators observed or expressed   Agreement Not to Harm Self Yes  Description of Agreement verbal  Danger to Others  Danger to Others None reported or observed

## 2021-11-10 NOTE — Plan of Care (Signed)
Nurse discussed anxiety, depression and coping skills with patient.  

## 2021-11-11 ENCOUNTER — Encounter (HOSPITAL_COMMUNITY): Payer: Self-pay

## 2021-11-11 NOTE — [facility] Group Notes (Signed)
Adult Psychoeducational Group Note  Date:  11/11/2021 Time:  2:50 PM  Group Topic/Focus:  Wellness Toolbox:   The focus of this group is to discuss various aspects of wellness, balancing those aspects and exploring ways to increase the ability to experience wellness.  Patients will create a wellness toolbox for use upon discharge.  Participation Level:  Active  Participation Quality:  Attentive  Affect:  Appropriate  Cognitive:  Alert  Insight: Appropriate  Engagement in Group:  Engaged  Modes of Intervention:  Discussion  Additional Comments:  Patient attended and participated in the therapeutic group.  Jearl Klinefelter 11/11/2021, 2:50 PM
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
## 2021-11-11 NOTE — Group Note (Signed)
LCSW Group Therapy Note   Group Date: 11/11/2021 Start Time: 1300 End Time: 1400   Type of Therapy and Topic:  Group Therapy: Boundaries  Participation Level:  Active  Description of Group: This group will address the use of boundaries in their personal lives. Patients will explore why boundaries are important, the difference between healthy and unhealthy boundaries, and negative and postive outcomes of different boundaries and will look at how boundaries can be crossed.  Patients will be encouraged to identify current boundaries in their own lives and identify what kind of boundary is being set. Facilitators will guide patients in utilizing problem-solving interventions to address and correct types boundaries being used and to address when no boundary is being used. Understanding and applying boundaries will be explored and addressed for obtaining and maintaining a balanced life. Patients will be encouraged to explore ways to assertively make their boundaries and needs known to significant others in their lives, using other group members and facilitator for role play, support, and feedback.  Therapeutic Goals:  1.  Patient will identify areas in their life where setting clear boundaries could be  used to improve their life.  2.  Patient will identify signs/triggers that a boundary is not being respected. 3.  Patient will identify two ways to set boundaries in order to achieve balance in  their lives: 4.  Patient will demonstrate ability to communicate their needs and set boundaries  through discussion and/or role plays  Summary of Patient Progress: Patient was present for the entirety of the group session. Patient was an active listener and participated in the topic of discussion, provided helpful advice to others, and added nuance to topic of conversation. Patient displayed enthusiasm concerning discussion topic of boundaries and added to the group.  Therapeutic Modalities:   Cognitive  Behavioral Therapy Solution-Focused Therapy  Ane Payment, LCSWA 11/11/2021  2:16 PM

## 2021-11-11 NOTE — Progress Notes (Signed)
Adult Psychoeducational Group Note  Date:  11/11/2021 Time:  9:40 PM  Group Topic/Focus:  AA/Wrap-Up  Participation Level:  Active  Participation Quality:  Appropriate  Affect:  Appropriate  Cognitive:  Appropriate  Insight: Appropriate  Engagement in Group:  Engaged  Modes of Intervention:  Discussion  Additional Comments:    Pt attended and participated in the AA/ Wrap Up group.  Edmund Hilda Corvin Sorbo 11/11/2021, 9:40 PM

## 2021-11-11 NOTE — Group Note (Signed)
Recreation Therapy Group Note   Group Topic:Stress Management  Group Date: 11/11/2021 Start Time: 0935 End Time: 0950 Facilitators: Caroll Rancher, LRT,CTRS Location: [address] Dayroom   Goal Area(s) Addresses:  Patient will actively participate in stress management techniques presented during session.  Patient will successfully identify benefit of practicing stress management post d/c.   Group Description: Guided Imagery. LRT provided education, instruction, and demonstration on practice of visualization via guided imagery. Patient was asked to participate in the technique introduced during session. LRT debriefed including topics of mindfulness, stress management and specific scenarios each patient could use these techniques. Patients were given suggestions of ways to access scripts post d/c and encouraged to explore Youtube and other apps available on smartphones, tablets, and computers.   Affect/Mood: Appropriate   Participation Level: Engaged   Participation Quality: Independent   Behavior: Attentive    Speech/Thought Process: Focused   Insight: Good   Judgement: Good   Modes of Intervention: Script   Patient Response to Interventions:  Engaged   Education Outcome:  Acknowledges education and In group clarification offered    Clinical Observations/Individualized Feedback: Pt attended and participated in group session.     Plan: Continue to engage patient in RT group sessions 2-3x/week.   Caroll Rancher, LRT,CTRS 11/11/2021 11:25 AM

## 2021-11-11 NOTE — [facility] Group Notes (Signed)
Adult Psychoeducational Group Note  Date:  11/11/2021 Time:  10:58 AM  Group Topic/Focus:  Orientation:   The focus of this group is to educate the patient on the purpose and policies of crisis stabilization and provide a format to answer questions about their admission.  The group details unit policies and expectations of patients while admitted.  Participation Level:  Active  Participation Quality:  Appropriate  Affect:  Appropriate  Cognitive:  Appropriate  Insight: Appropriate  Engagement in Group:  Engaged  Modes of Intervention:  Discussion  Additional Comments:  Patient attended morning orientation group and participated.   Diani Jillson W Syre Knerr 11/11/2021, 10:58 AM

## 2021-11-11 NOTE — Progress Notes (Signed)
[facility] MD Progress Note  11/11/2021 12:27 PM Chad Odonnell  MRN:  [id number]  Subjective:    Chad Odonnell is a 21 year old male with a past medical history significant for multiple head traumas and substance use who was admitted involuntarily to the [facility] for increasing suicidal ideation and homicidal ideation.   The patient is being treated for the following psychiatric diagnoses:   MDD (major depressive disorder), recurrent severe, without psychosis    PTSD (post-traumatic stress disorder)   GAD (generalized anxiety disorder)   Nicotine use disorder   Cannabis abuse R/o impulse control d/o Cluster B traits   Yesterday the psychiatry team made the following recommendations -Discontinue Effexor 75mg  once daily, today 11-10-21 -Continue zyprexa 5 mg tid for mood stability and impulsivity -Start Prozac 20 mg once daily (first dose 8-25) -Continue Remeron 15mg  qhs for mdd, insomnia -Increase gabapentin 300 mg tid for anxiety, mood stability, and impulse control -continue Trazodone 50 mg nightly PRN for insomnia -continue hydroxyzine Q2 PRN for anxiety  On my exam today, the patient reports that his mood is more stable and his mood is less depressed.  He reports that the current medication regimen is working well for mood stabilization, treatment of mood, and anxiety.  He reports only having 1 time when he felt overwhelmed, which he was able to control himself with coping techniques and self soothing yesterday.  This is a significant improvement.  He reports sleep is okay and appetite is better.  He reports concentration is better.  He is able to maintain a pleasant conversation without becoming overwhelmed, agitated, irritable or argumentative during the interview today.  This is an improvement.  Denies SI.  Denies HI.  Denies psychotic symptoms.  Denies side effects to current scheduled psychiatric medications.   Principal Problem: MDD (major depressive disorder),  recurrent severe, without psychosis (HCC) Diagnosis: Principal Problem:   MDD (major depressive disorder), recurrent severe, without psychosis (HCC) Active Problems:   PTSD (post-traumatic stress disorder)   GAD (generalized anxiety disorder)   Nicotine use disorder   Cannabis abuse  Total Time spent with patient: 20 minutes  Past Psychiatric History:  Previous Psych Diagnoses: Depression Prior inpatient treatment: [facility] in [place], back in January or February 2023 Prior outpatient treatment: denies Prior rehab hx: denies Psychotherapy hx: denies History of suicide: six attempts, most recent was January 2023 History of homicide:  denies Psychiatric medication history: trialed Zoloft for a month Psychiatric medication compliance history: noncompliant, took zoloft prn Neuromodulation history: denies Current Psychiatrist: denies Current therapist: denies     Past Medical History:  Past Medical History:  Diagnosis Date   Enlarged kidney    right side   Testicle swelling right bigger than left    Past Surgical History:  Procedure Laterality Date   APPENDECTOMY     PYELOPLASTY Right 11/05/2013   [facility]   URETERAL STENT PLACEMENT  11/05/2013   Family History: History reviewed. No pertinent family history.  Family Psychiatric  History:  Per chart review history of depression in brother, father, grandfather (PTSD from war, agent orange exposure), schizophrenia in his uncle, anxiety/depression in mom    Social History:  Social History   Substance and Sexual Activity  Alcohol Use No     Social History   Substance and Sexual Activity  Drug Use Yes   Types: Marijuana   Comment: last use last week    Social History   Socioeconomic History   Marital status: Single  Spouse name: Not on file   Number of children: Not on file   Years of education: Not on file   Highest education level: Not on file  Occupational History   Not on file  Tobacco Use    Smoking status: Former    Packs/day: 0.50    Types: Cigars, Cigarettes   Smokeless tobacco: Current   Tobacco comments:    vape  Vaping Use   Vaping Use: Every day  Substance and Sexual Activity   Alcohol use: No   Drug use: Yes    Types: Marijuana    Comment: last use last week   Sexual activity: Yes  Other Topics Concern   Not on file  Social History Narrative   Not on file   Social Determinants of Health   Financial Resource Strain: Not on file  Food Insecurity: Not on file  Transportation Needs: Not on file  Physical Activity: Not on file  Stress: Not on file  Social Connections: Not on file   Additional Social History:                         Sleep: Good  Appetite:  Good  Current Medications: Current Facility-Administered Medications  Medication Dose Route Frequency Provider Last Rate Last Admin   acetaminophen (TYLENOL) tablet 650 mg  650 mg Oral Q6H PRN Ntuen, Jesusita Oka, FNP       alum & mag hydroxide-simeth (MAALOX/MYLANTA) 200-200-20 MG/5ML suspension 30 mL  30 mL Oral Q4H PRN Ntuen, Jesusita Oka, FNP       feeding supplement (ENSURE ENLIVE / ENSURE PLUS) liquid 237 mL  237 mL Oral BID BM Rjay Revolorio, MD   237 mL at 11/11/21 1001   FLUoxetine (PROZAC) capsule 20 mg  20 mg Oral Daily Starletta Houchin, MD   20 mg at 11/11/21 0754   gabapentin (NEURONTIN) capsule 300 mg  300 mg Oral TID Phineas Inches, MD   300 mg at 11/11/21 1137   hydrOXYzine (ATARAX) tablet 25 mg  25 mg Oral Q2H PRN Yalena Colon, Harrold Donath, MD   25 mg at 11/10/21 2052   LORazepam (ATIVAN) tablet 1 mg  1 mg Oral TID PRN Phineas Inches, MD   1 mg at 11/08/21 2125   magnesium hydroxide (MILK OF MAGNESIA) suspension 30 mL  30 mL Oral Daily PRN Ntuen, Jesusita Oka, FNP       mirtazapine (REMERON) tablet 15 mg  15 mg Oral QHS Lamar Sprinkles, MD   15 mg at 11/10/21 2052   multivitamin with minerals tablet 1 tablet  1 tablet Oral Daily Cynda Soule, MD   1 tablet at 11/11/21 0754    nicotine (NICODERM CQ - dosed in mg/24 hours) patch 14 mg  14 mg Transdermal Daily Mishal Probert, Harrold Donath, MD   14 mg at 11/11/21 0715   OLANZapine (ZYPREXA) injection 5 mg  5 mg Intramuscular TID PRN Kim Lauver, Harrold Donath, MD       OLANZapine zydis (ZYPREXA) disintegrating tablet 5 mg  5 mg Oral TID PRN Stokely Jeancharles, Harrold Donath, MD   5 mg at 11/09/21 1558   OLANZapine zydis (ZYPREXA) disintegrating tablet 5 mg  5 mg Oral Q8H Trinitee Horgan, MD   5 mg at 11/11/21 0614   propranolol (INDERAL) tablet 10 mg  10 mg Oral Q12H Haig Gerardo, Harrold Donath, MD   10 mg at 11/11/21 0754   traZODone (DESYREL) tablet 50 mg  50 mg Oral QHS PRN Phineas Inches, MD   50 mg at 11/10/21  2052    Lab Results:  Results for orders placed or performed during the hospital encounter of 11/06/21 (from the past 48 hour(s))  Basic metabolic panel     Status: None   Collection Time: 11/10/21  6:26 AM  Result Value Ref Range   Sodium 141 135 - 145 mmol/L   Potassium 3.9 3.5 - 5.1 mmol/L   Chloride 106 98 - 111 mmol/L   CO2 26 22 - 32 mmol/L   Glucose, Bld 96 70 - 99 mg/dL    Comment: Glucose reference range applies only to samples taken after fasting for at least 8 hours.   BUN 11 6 - 20 mg/dL   Creatinine, Ser 5.17 0.61 - 1.24 mg/dL   Calcium 9.8 8.9 - 61.6 mg/dL   GFR, Estimated >07 >37 mL/min    Comment: (NOTE) Calculated using the CKD-EPI Creatinine Equation (2021)    Anion gap 9 5 - 15    Comment: Performed at [facility], [address], [place], [place] [address]    Blood Alcohol level:  Lab Results  Component Value Date   ETH <10 11/05/2021    Metabolic Disorder Labs: Lab Results  Component Value Date   HGBA1C 4.5 (L) 11/07/2021   MPG 82.45 11/07/2021   No results found for: "PROLACTIN" Lab Results  Component Value Date   CHOL 139 11/07/2021   TRIG 71 11/07/2021   HDL 33 (L) 11/07/2021   CHOLHDL 4.2 11/07/2021   VLDL 14 11/07/2021   LDLCALC 92 11/07/2021    Physical  Findings: AIMS: Facial and Oral Movements Muscles of Facial Expression: None, normal Lips and Perioral Area: None, normal Jaw: None, normal Tongue: None, normal,Extremity Movements Upper (arms, wrists, hands, fingers): None, normal Lower (legs, knees, ankles, toes): None, normal, Trunk Movements Neck, shoulders, hips: None, normal, Overall Severity Severity of abnormal movements (highest score from questions above): None, normal Incapacitation due to abnormal movements: None, normal Patient's awareness of abnormal movements (rate only patient's report): No Awareness, Dental Status Current problems with teeth and/or dentures?: No Does patient usually wear dentures?: No  CIWA:    COWS:     Musculoskeletal: Strength & Muscle Tone: within normal limits Gait & Station: normal Patient leans: N/A  Psychiatric Specialty Exam:  Presentation  General Appearance: Casual; Appropriate for Environment; Fairly Groomed  Eye Contact:Fair  Speech:Clear and Coherent; Normal Rate  Speech Volume:Normal  Handedness:Right   Mood and Affect  Mood:Anxious  Affect:Congruent; Appropriate; Full Range   Thought Process  Thought Processes:Linear  Descriptions of Associations:Intact  Orientation:Full (Time, Place and Person)  Thought Content:Logical  History of Schizophrenia/Schizoaffective disorder:No data recorded Duration of Psychotic Symptoms:No data recorded Hallucinations:Hallucinations: None  Ideas of Reference:None  Suicidal Thoughts:Suicidal Thoughts: No  Homicidal Thoughts:Homicidal Thoughts: No   Sensorium  Memory:Remote Good; Immediate Good; Recent Good  Judgment:Fair  Insight:Fair   Executive Functions  Concentration:Fair  Attention Span:Fair  Recall:Fair  Fund of Knowledge:Fair  Language:Fair   Psychomotor Activity  Psychomotor Activity:Psychomotor Activity: Normal   Assets  Assets:Desire for Improvement; Social Support   Sleep  Sleep:Sleep:  Fair    Physical Exam: Physical Exam Vitals reviewed.  Pulmonary:     Effort: Pulmonary effort is normal.  Neurological:     Mental Status: He is alert.     Motor: No weakness.     Gait: Gait normal.  Psychiatric:        Behavior: Behavior normal.    Review of Systems  Constitutional:  Negative for chills and  fever.  Cardiovascular:  Negative for chest pain and palpitations.  Neurological:  Negative for dizziness, tingling, tremors and headaches.  Psychiatric/Behavioral:  Negative for suicidal ideas. The patient is not nervous/anxious and does not have insomnia.    Blood pressure 125/87, pulse 89, temperature 97.7 F (36.5 C), temperature source Oral, resp. rate 18, height 5\' 9"  (1.753 m), weight 58.5 kg, SpO2 99 %. Body mass index is 19.05 kg/m.   Treatment Plan Summary: Daily contact with patient to assess and evaluate symptoms and progress in treatment  ASSESSMENT: Principal Problem:   MDD (major depressive disorder), recurrent severe, without psychosis (HCC) Active Problems:   PTSD (post-traumatic stress disorder)   GAD (generalized anxiety disorder)   Nicotine use disorder   Cannabis abuse Rule out impulse control disorder Cluster B traits     PLAN: Safety and Monitoring:             -- Involuntary admission to inpatient psychiatric unit for safety, stabilization and treatment             -- Daily contact with patient to assess and evaluate symptoms and progress in treatment             -- Patient's case to be discussed in multi-disciplinary team meeting             -- Observation Level : q15 minute checks             -- Vital signs:  q12 hours             -- Precautions: suicide, elopement, and assault   2. Psychiatric Diagnoses and Treatment:  #MDD #GAD -Continue zyprexa 5 mg tid for mood stability and impulsivity -Continue Prozac 20 mg once daily (first dose 8-24) -Continue Remeron 15mg  qhs for mdd, insomnia -Continue gabapentin 300 mg tid for anxiety,  mood stability, and impulse control -continue Trazodone 50 mg nightly PRN for insomnia -Continue propranolol 10 mg bid for tachycardia and anxiety  -continue hydroxyzine Q2 PRN for anxiety  -Previously discontinued Effexor 75mg  once daily, today 11-10-21         -- The risks/benefits/side-effects/alternatives to this medication were discussed in detail with the patient and time was given for questions. The patient consents to medication trial.              -- Metabolic profile and EKG monitoring obtained while on an atypical antipsychotic  BMI: 19.05 kg/m2 Lipid Panel: normal HbgA1c: 4.5 QTc: 399 from 11/06/21 @ 11:11             -- Encouraged patient to participate in unit milieu and in scheduled group therapies    3. Medical Diagnoses and Treatment: -nicotine use disorder Continue nicotine patch 14mg  once daily    4. Discharge Planning:              -- Social work and case management to assist with discharge planning and identification of hospital follow-up needs prior to discharge             -- Estimated LOS 5-7 days             -- Discharge Concerns: Need to establish a safety plan             -- Discharge Goals: Return home with outpatient referrals for mental health follow-up including medication management/psychotherapy    , MD 11/11/2021, 12:27 PM  Total Time Spent in Direct Patient Care:  I personally spent 35 minutes on the unit  in direct patient care. The direct patient care time included face-to-face time with the patient, reviewing the patient's chart, communicating with other professionals, and coordinating care. Greater than 50% of this time was spent in counseling or coordinating care with the patient regarding goals of hospitalization, psycho-education, and discharge planning needs.   Phineas InchesNathan Anniebell Bedore, MD Psychiatrist

## 2021-11-11 NOTE — BH IP Treatment Plan (Signed)
Interdisciplinary Treatment and Diagnostic Plan Update  11/11/2021 Time of Session: 9:40am  Chad Odonnell MRN: [id number]  Principal Diagnosis: MDD (major depressive disorder), recurrent severe, without psychosis (HCC)  Secondary Diagnoses: Principal Problem:   MDD (major depressive disorder), recurrent severe, without psychosis (HCC) Active Problems:   PTSD (post-traumatic stress disorder)   GAD (generalized anxiety disorder)   Nicotine use disorder   Cannabis abuse   Current Medications:  Current Facility-Administered Medications  Medication Dose Route Frequency Provider Last Rate Last Admin   acetaminophen (TYLENOL) tablet 650 mg  650 mg Oral Q6H PRN Ntuen, Jesusita Oka, FNP       alum & mag hydroxide-simeth (MAALOX/MYLANTA) 200-200-20 MG/5ML suspension 30 mL  30 mL Oral Q4H PRN Ntuen, Jesusita Oka, FNP       feeding supplement (ENSURE ENLIVE / ENSURE PLUS) liquid 237 mL  237 mL Oral BID BM Massengill, Nathan, MD   237 mL at 11/11/21 1001   FLUoxetine (PROZAC) capsule 20 mg  20 mg Oral Daily Massengill, Nathan, MD   20 mg at 11/11/21 0754   gabapentin (NEURONTIN) capsule 300 mg  300 mg Oral TID Phineas Inches, MD   300 mg at 11/11/21 0754   hydrOXYzine (ATARAX) tablet 25 mg  25 mg Oral Q2H PRN Massengill, Harrold Donath, MD   25 mg at 11/10/21 2052   LORazepam (ATIVAN) tablet 1 mg  1 mg Oral TID PRN Phineas Inches, MD   1 mg at 11/08/21 2125   magnesium hydroxide (MILK OF MAGNESIA) suspension 30 mL  30 mL Oral Daily PRN Ntuen, Jesusita Oka, FNP       mirtazapine (REMERON) tablet 15 mg  15 mg Oral QHS Lamar Sprinkles, MD   15 mg at 11/10/21 2052   multivitamin with minerals tablet 1 tablet  1 tablet Oral Daily Massengill, Nathan, MD   1 tablet at 11/11/21 0754   nicotine (NICODERM CQ - dosed in mg/24 hours) patch 14 mg  14 mg Transdermal Daily Massengill, Harrold Donath, MD   14 mg at 11/11/21 0715   OLANZapine (ZYPREXA) injection 5 mg  5 mg Intramuscular TID PRN Massengill, Harrold Donath, MD       OLANZapine  zydis (ZYPREXA) disintegrating tablet 5 mg  5 mg Oral TID PRN Massengill, Harrold Donath, MD   5 mg at 11/09/21 1558   OLANZapine zydis (ZYPREXA) disintegrating tablet 5 mg  5 mg Oral Q8H Massengill, Nathan, MD   5 mg at 11/11/21 4196   propranolol (INDERAL) tablet 10 mg  10 mg Oral Q12H Massengill, Harrold Donath, MD   10 mg at 11/11/21 0754   traZODone (DESYREL) tablet 50 mg  50 mg Oral QHS PRN Phineas Inches, MD   50 mg at 11/10/21 2052   PTA Medications: No medications prior to admission.    Patient Stressors: Legal issue   Marital or family conflict   Medication change or noncompliance    Patient Strengths: Ability for insight  Average or above average intelligence  Communication skills   Treatment Modalities: Medication Management, Group therapy, Case management,  1 to 1 session with clinician, Psychoeducation, Recreational therapy.   Physician Treatment Plan for Primary Diagnosis: MDD (major depressive disorder), recurrent severe, without psychosis (HCC) Long Term Goal(s):     Short Term Goals:    Medication Management: Evaluate patient's response, side effects, and tolerance of medication regimen.  Therapeutic Interventions: 1 to 1 sessions, Unit Group sessions and Medication administration.  Evaluation of Outcomes: Progressing  Physician Treatment Plan for Secondary Diagnosis: Principal Problem:  MDD (major depressive disorder), recurrent severe, without psychosis (HCC) Active Problems:   PTSD (post-traumatic stress disorder)   GAD (generalized anxiety disorder)   Nicotine use disorder   Cannabis abuse  Long Term Goal(s):     Short Term Goals:       Medication Management: Evaluate patient's response, side effects, and tolerance of medication regimen.  Therapeutic Interventions: 1 to 1 sessions, Unit Group sessions and Medication administration.  Evaluation of Outcomes: Progressing   RN Treatment Plan for Primary Diagnosis: MDD (major depressive disorder), recurrent  severe, without psychosis (HCC) Long Term Goal(s): Knowledge of disease and therapeutic regimen to maintain health will improve  Short Term Goals: Ability to remain free from injury will improve, Ability to participate in decision making will improve, Ability to verbalize feelings will improve, Ability to disclose and discuss suicidal ideas, and Ability to identify and develop effective coping behaviors will improve  Medication Management: RN will administer medications as ordered by provider, will assess and evaluate patient's response and provide education to patient for prescribed medication. RN will report any adverse and/or side effects to prescribing provider.  Therapeutic Interventions: 1 on 1 counseling sessions, Psychoeducation, Medication administration, Evaluate responses to treatment, Monitor vital signs and CBGs as ordered, Perform/monitor CIWA, COWS, AIMS and Fall Risk screenings as ordered, Perform wound care treatments as ordered.  Evaluation of Outcomes: Progressing   LCSW Treatment Plan for Primary Diagnosis: MDD (major depressive disorder), recurrent severe, without psychosis (HCC) Long Term Goal(s): Safe transition to appropriate next level of care at discharge, Engage patient in therapeutic group addressing interpersonal concerns.  Short Term Goals: Engage patient in aftercare planning with referrals and resources, Increase social support, Increase emotional regulation, Facilitate acceptance of mental health diagnosis and concerns, Identify triggers associated with mental health/substance abuse issues, and Increase skills for wellness and recovery  Therapeutic Interventions: Assess for all discharge needs, 1 to 1 time with Social worker, Explore available resources and support systems, Assess for adequacy in community support network, Educate family and significant other(s) on suicide prevention, Complete Psychosocial Assessment, Interpersonal group therapy.  Evaluation of  Outcomes: Progressing   Progress in Treatment: Attending groups: Yes. Participating in groups: Yes. Taking medication as prescribed: Yes. Toleration medication: Yes. Family/Significant other contact made: Yes, will contact:  Mother. Patient understands diagnosis: Yes. Discussing patient identified problems/goals with staff: Yes. Medical problems stabilized or resolved: Yes. Denies suicidal/homicidal ideation: Yes. Issues/concerns per patient self-inventory: No.   New problem(s) identified: No, Describe:  none reported   New Short Term/Long Term Goal(s):    medication stabilization, elimination of SI thoughts, development of comprehensive mental wellness plan.      Patient Goals:  Patient states, "to be in a better mindset than I am in now"   Discharge Plan or Barriers: Patient recently admitted. CSW will continue to follow and assess for appropriate referrals and possible discharge planning.      Reason for Continuation of Hospitalization: Anxiety Depression Homicidal ideation Medication stabilization Suicidal ideation   Estimated Length of Stay: 3 to 7 days     Last 3 [place] Suicide Severity Risk Score: Flowsheet Row [facility]Current[facility]05/2021 in [facility]  C-SSRS RISK CATEGORY High Risk High Risk       Last PHQ 2/9 Scores:     No data to display          Scribe for Treatment Team: Aram Beecham, Theresia Majors 11/11/2021 10:20 AM

## 2021-11-12 NOTE — Group Note (Signed)
LCSW Group Therapy Note  11/12/2021      Topic:  Anger Healthy and Unhealthy Coping Skills  Participation Level:  Active  Description of Group:   In this group, patients identified their own common triggers and typical reactions then analyzed how these reactions are possibly beneficial and possibly unhelpful.  Focus was placed on examining whether typical coping skills are healthy or unhealthy.  Therapeutic Goals: Patients will share situations that commonly incite their anger and how they typically respond Patients will identify how their coping skills work for them and/or against them Patients will explore possible alternative coping skills Patients will learn that anger itself is normal and that healthier reactions can assist with resolving conflict rather than worsening situations  Summary of Patient Progress:  The patient shared that his frequent cause of anger is "everything irritates me."  Choice of coping skill is often to seclude himself in order to protect other people from harm, which was identified as a unhealthy choice because it hurts him as well.  He was very active and talkative throughout group.  Therapeutic Modalities:   Cognitive Behavioral Therapy Processing  Lynnell Chad, LCSW

## 2021-11-12 NOTE — Progress Notes (Signed)
   11/12/21 2350  Psych Admission Type (Psych Patients Only)  Admission Status Involuntary  Psychosocial Assessment  Patient Complaints Anxiety;Agitation;Irritability  Eye Contact Fair  Facial Expression Animated;Anxious  Affect Apprehensive;Appropriate to circumstance;Anxious  Speech Logical/coherent  Interaction Attention-seeking;Assertive;Intrusive;Childlike  Motor Activity Fidgety  Appearance/Hygiene Unremarkable  Behavior Characteristics Anxious;Agitated;Fidgety;Intrusive  Mood Anxious;Labile  Thought Process  Coherency WDL  Content WDL  Delusions None reported or observed  Perception WDL  Hallucination None reported or observed  Judgment Poor  Confusion None  Danger to Self  Current suicidal ideation? Denies  Self-Injurious Behavior No self-injurious ideation or behavior indicators observed or expressed   Agreement Not to Harm Self Yes  Description of Agreement verbal  Danger to Others  Danger to Others None reported or observed

## 2021-11-12 NOTE — Progress Notes (Signed)
[facility] MD Progress Note  11/12/2021 2:47 PM Chad Odonnell  MRN:  [id number]  Subjective: Chad Odonnell states, "I feel good because I had a good night sleep."  Brief history: Chad Odonnell is a 21 year old male with a past medical history significant for multiple head traumas and substance use who was admitted involuntarily to the [facility] for increasing suicidal ideation and homicidal ideation.   The patient is being treated for the following psychiatric diagnoses:   MDD (major depressive disorder), recurrent severe, without psychosis    PTSD (post-traumatic stress disorder)   GAD (generalized anxiety disorder)   Nicotine use disorder   Cannabis abuse R/o impulse control d/o Cluster B traits   Yesterday the psychiatry team made the following recommendations -Discontinue Effexor 75mg  once daily, today 11-10-21 -Continue zyprexa 5 mg tid for mood stability and impulsivity -Start Prozac 20 mg once daily (first dose 8-25) -Continue Remeron 15mg  qhs for mdd, insomnia -Increase gabapentin 300 mg tid for anxiety, mood stability, and impulse control -continue Trazodone 50 mg nightly PRN for insomnia -continue hydroxyzine Q2 PRN for anxiety  Today's assessment: Patient is seen face-to-face and examined sitting up on his bed.  Appears calm and cooperating with the exam.  Chart reviewed and findings shared with the treatment team and discussed with Dr. 11-22-1979.  Alert and oriented to person, place, time, and situation.  Patient presents with less depressed mood and reiterated that the medications are working for him to stabilize his mood and anxiety.  Reported not feeling overwhelmed and made this provider aware that he would use coping skills he is learning from therapeutic milieu to cope with stress.  Patient reports sleeping over 8 hours last night and feeling restful. Patient appears to be managing his mood well at this time. Reports having fair appetite and drinking enough fluid  for hydration.  Encouraged to continue with his Ensure drinks p.o. daily for appetite stimulation and nourishment.  He denies suicidal ideation, homicidal ideation, paranoia, delusions or auditory/visual hallucinations.  Complaint of constipation and added that he only had 1 stool that was hard since his admission.  Colace 100 mg p.o. daily ordered for stool softener. Denies side effects to current scheduled psychiatric medications, and denies psychotic symptoms.  Principal Problem: MDD (major depressive disorder), recurrent severe, without psychosis (HCC) Diagnosis: Principal Problem:   MDD (major depressive disorder), recurrent severe, without psychosis (HCC) Active Problems:   PTSD (post-traumatic stress disorder)   GAD (generalized anxiety disorder)   Nicotine use disorder   Cannabis abuse  Total Time spent with patient: 35 minutes  Past Psychiatric History:  Previous Psych Diagnoses: Depression Prior inpatient treatment: [facility] in [place], [place] back in January or February 2023 Prior outpatient treatment: denies Prior rehab hx: denies Psychotherapy hx: denies History of suicide: six attempts, most recent was January 2023 History of homicide:  denies Psychiatric medication history: trialed Zoloft for a month Psychiatric medication compliance history: noncompliant, took zoloft prn Neuromodulation history: denies Current Psychiatrist: denies Current therapist: denies   Past Medical History:  Past Medical History:  Diagnosis Date   Enlarged kidney    right side   Testicle swelling right bigger than left    Past Surgical History:  Procedure Laterality Date   APPENDECTOMY     PYELOPLASTY Right 11/05/2013   [facility]   URETERAL STENT PLACEMENT  11/05/2013   Family History: History reviewed. No pertinent family history.  Family Psychiatric  History:  Per chart review history of depression in brother, father,  grandfather (PTSD from war, agent orange exposure), schizophrenia  in his uncle, anxiety/depression in mom    Social History:  Social History   Substance and Sexual Activity  Alcohol Use No     Social History   Substance and Sexual Activity  Drug Use Yes   Types: Marijuana   Comment: last use last week    Social History   Socioeconomic History   Marital status: Single    Spouse name: Not on file   Number of children: Not on file   Years of education: Not on file   Highest education level: Not on file  Occupational History   Not on file  Tobacco Use   Smoking status: Former    Packs/day: 0.50    Types: Cigars, Cigarettes   Smokeless tobacco: Current   Tobacco comments:    vape  Vaping Use   Vaping Use: Every day  Substance and Sexual Activity   Alcohol use: No   Drug use: Yes    Types: Marijuana    Comment: last use last week   Sexual activity: Yes  Other Topics Concern   Not on file  Social History Narrative   Not on file   Social Determinants of Health   Financial Resource Strain: Not on file  Food Insecurity: Not on file  Transportation Needs: Not on file  Physical Activity: Not on file  Stress: Not on file  Social Connections: Not on file   Additional Social History:    Sleep: Good  Appetite:  Good  Current Medications: Current Facility-Administered Medications  Medication Dose Route Frequency Provider Last Rate Last Admin   acetaminophen (TYLENOL) tablet 650 mg  650 mg Oral Q6H PRN Moriah Shawley, Jesusita Oka, FNP   650 mg at 11/11/21 1601   alum & mag hydroxide-simeth (MAALOX/MYLANTA) 200-200-20 MG/5ML suspension 30 mL  30 mL Oral Q4H PRN Arjay Jaskiewicz, Jesusita Oka, FNP       feeding supplement (ENSURE ENLIVE / ENSURE PLUS) liquid 237 mL  237 mL Oral BID BM Massengill, Nathan, MD   237 mL at 11/12/21 1423   FLUoxetine (PROZAC) capsule 20 mg  20 mg Oral Daily Massengill, Nathan, MD   20 mg at 11/12/21 0973   gabapentin (NEURONTIN) capsule 300 mg  300 mg Oral TID Phineas Inches, MD   300 mg at 11/12/21 1251   hydrOXYzine (ATARAX)  tablet 25 mg  25 mg Oral Q2H PRN Massengill, Harrold Donath, MD   25 mg at 11/12/21 1424   LORazepam (ATIVAN) tablet 1 mg  1 mg Oral TID PRN Phineas Inches, MD   1 mg at 11/08/21 2125   magnesium hydroxide (MILK OF MAGNESIA) suspension 30 mL  30 mL Oral Daily PRN Alan Mulder C, FNP   30 mL at 11/12/21 0948   mirtazapine (REMERON) tablet 15 mg  15 mg Oral QHS Lamar Sprinkles, MD   15 mg at 11/11/21 2101   multivitamin with minerals tablet 1 tablet  1 tablet Oral Daily Massengill, Nathan, MD   1 tablet at 11/12/21 5329   nicotine (NICODERM CQ - dosed in mg/24 hours) patch 14 mg  14 mg Transdermal Daily Massengill, Harrold Donath, MD   14 mg at 11/12/21 0818   OLANZapine (ZYPREXA) injection 5 mg  5 mg Intramuscular TID PRN Massengill, Harrold Donath, MD       OLANZapine zydis (ZYPREXA) disintegrating tablet 5 mg  5 mg Oral TID PRN Phineas Inches, MD   5 mg at 11/09/21 1558   OLANZapine zydis (ZYPREXA) disintegrating tablet  5 mg  5 mg Oral Q8H Massengill, Nathan, MD   5 mg at 11/12/21 1443   propranolol (INDERAL) tablet 10 mg  10 mg Oral Q12H Massengill, Harrold Donath, MD   10 mg at 11/12/21 1540   traZODone (DESYREL) tablet 50 mg  50 mg Oral QHS PRN Phineas Inches, MD   50 mg at 11/11/21 2101    Lab Results:  No results found for this or any previous visit (from the past 48 hour(s)).   Blood Alcohol level:  Lab Results  Component Value Date   ETH <10 11/05/2021    Metabolic Disorder Labs: Lab Results  Component Value Date   HGBA1C 4.5 (L) 11/07/2021   MPG 82.45 11/07/2021   No results found for: "PROLACTIN" Lab Results  Component Value Date   CHOL 139 11/07/2021   TRIG 71 11/07/2021   HDL 33 (L) 11/07/2021   CHOLHDL 4.2 11/07/2021   VLDL 14 11/07/2021   LDLCALC 92 11/07/2021    Physical Findings: AIMS: Facial and Oral Movements Muscles of Facial Expression: None, normal Lips and Perioral Area: None, normal Jaw: None, normal Tongue: None, normal,Extremity Movements Upper (arms, wrists, hands,  fingers): None, normal Lower (legs, knees, ankles, toes): None, normal, Trunk Movements Neck, shoulders, hips: None, normal, Overall Severity Severity of abnormal movements (highest score from questions above): None, normal Incapacitation due to abnormal movements: None, normal Patient's awareness of abnormal movements (rate only patient's report): No Awareness, Dental Status Current problems with teeth and/or dentures?: No Does patient usually wear dentures?: No  CIWA:    COWS:     Musculoskeletal: Strength & Muscle Tone: within normal limits Gait & Station: normal Patient leans: N/A  Psychiatric Specialty Exam:  Presentation  General Appearance: Appropriate for Environment; Casual; Fairly Groomed  Eye Contact:Good  Speech:Clear and Coherent; Normal Rate  Speech Volume:Normal  Handedness:Right  Mood and Affect  Mood:Anxious  Affect:Appropriate; Congruent  Thought Process  Thought Processes:Linear  Descriptions of Associations:Intact  Orientation:Full (Time, Place and Person)  Thought Content:WDL  History of Schizophrenia/Schizoaffective disorder:No data recorded Duration of Psychotic Symptoms:No data recorded Hallucinations:Hallucinations: None  Ideas of Reference:None  Suicidal Thoughts:Suicidal Thoughts: No SI Active Intent and/or Plan: -- (n/a) SI Passive Intent and/or Plan: -- (n/a)  Homicidal Thoughts:Homicidal Thoughts: No HI Passive Intent and/or Plan: -- (n/a)   Sensorium  Memory:Recent Fair; Immediate Good; Recent Good; Remote Good  Judgment:Fair  Insight:Fair  Executive Functions  Concentration:Fair  Attention Span:Good  Recall:Fair  Fund of Knowledge:Fair  Language:Good  Psychomotor Activity  Psychomotor Activity:Psychomotor Activity: Normal  Assets  Assets:Communication Skills; Desire for Improvement; Social Support  Sleep  Sleep:Sleep: Good Number of Hours of Sleep: 8  Physical Exam: Physical Exam Vitals and nursing  note reviewed.  Constitutional:      Appearance: Normal appearance.  HENT:     Head: Normocephalic and atraumatic.     Right Ear: External ear normal.     Left Ear: External ear normal.     Mouth/Throat:     Mouth: Mucous membranes are moist.     Pharynx: Oropharynx is clear.  Eyes:     Conjunctiva/sclera: Conjunctivae normal.     Pupils: Pupils are equal, round, and reactive to light.  Cardiovascular:     Rate and Rhythm: Normal rate.     Pulses: Normal pulses.  Pulmonary:     Effort: Pulmonary effort is normal.  Abdominal:     Palpations: Abdomen is soft.  Genitourinary:    Comments: deferred Musculoskeletal:  General: Normal range of motion.     Cervical back: Normal range of motion and neck supple.  Skin:    General: Skin is warm.  Neurological:     General: No focal deficit present.     Mental Status: He is alert and oriented to person, place, and time.     Motor: No weakness.     Gait: Gait normal.  Psychiatric:        Behavior: Behavior normal.    Review of Systems  Constitutional:  Negative for chills and fever.  HENT: Negative.  Negative for hearing loss and tinnitus.   Eyes: Negative.  Negative for blurred vision and double vision.  Respiratory: Negative.  Negative for cough, sputum production, shortness of breath and wheezing.   Cardiovascular:  Negative for chest pain and palpitations.  Gastrointestinal: Negative.  Negative for heartburn and nausea.  Genitourinary: Negative.  Negative for dysuria, frequency and urgency.  Musculoskeletal: Negative.  Negative for myalgias and neck pain.  Skin: Negative.  Negative for rash.  Neurological:  Negative for dizziness, tingling, tremors and headaches.  Endo/Heme/Allergies: Negative.  Negative for environmental allergies and polydipsia. Does not bruise/bleed easily.  Psychiatric/Behavioral:  Positive for depression, substance abuse and suicidal ideas. The patient is nervous/anxious. The patient does not have  insomnia.    Blood pressure 122/88, pulse 74, temperature 98.1 F (36.7 C), temperature source Oral, resp. rate 18, height 5\' 9"  (1.753 m), weight 58.5 kg, SpO2 99 %. Body mass index is 19.05 kg/m.   Treatment Plan Summary: Daily contact with patient to assess and evaluate symptoms and progress in treatment  ASSESSMENT: Principal Problem:   MDD (major depressive disorder), recurrent severe, without psychosis (HCC) Active Problems:   PTSD (post-traumatic stress disorder)   GAD (generalized anxiety disorder)   Nicotine use disorder   Cannabis abuse Rule out impulse control disorder Cluster B traits     PLAN: Safety and Monitoring:             -- Involuntary admission to inpatient psychiatric unit for safety, stabilization and treatment             -- Daily contact with patient to assess and evaluate symptoms and progress in treatment             -- Patient's case to be discussed in multi-disciplinary team meeting             -- Observation Level : q15 minute checks             -- Vital signs:  q12 hours             -- Precautions: suicide, elopement, and assault   2. Psychiatric Diagnoses and Treatment:  #MDD #GAD -Continue zyprexa 5 mg tid for mood stability and impulsivity -Continue Prozac 20 mg once daily (first dose 8-24) -Continue Remeron 15mg  qhs for mdd, insomnia -Continue gabapentin 300 mg tid for anxiety, mood stability, and impulse control -continue Trazodone 50 mg nightly PRN for insomnia -Continue propranolol 10 mg bid for tachycardia and anxiety  -continue hydroxyzine Q2 PRN for anxiety  -Previously discontinued Effexor 75mg  once daily, today 11-10-21   -- The risks/benefits/side-effects/alternatives to this medication were discussed in detail with the patient and time was given for questions. The patient consents to medication trial.              -- Metabolic profile and EKG monitoring obtained while on an atypical antipsychotic  BMI: 19.05 kg/m2 Lipid Panel:  normal HbgA1c: 4.5  QTc: 399 from 11/06/21 @ 11:11             -- Encouraged patient to participate in unit milieu and in scheduled group therapies    3. Medical Diagnoses and Treatment: -nicotine use disorder Continue nicotine patch 14mg  once daily    4. Discharge Planning:              -- Social work and case management to assist with discharge planning and identification of hospital follow-up needs prior to discharge             -- Estimated LOS 5-7 days             -- Discharge Concerns: Need to establish a safety plan             -- Discharge Goals: Return home with outpatient referrals for mental health follow-up including medication management/psychotherapy    Cecilie Lowersina C Nash Bolls, FNP 11/12/2021, 2:47 PM  Patient ID: Chad Odonnell, male   DOB: 2000-03-31, 21 y.o.   MRN: [id number]

## 2021-11-12 NOTE — Progress Notes (Signed)
D. Pt was friendly upon initial approach, but stated that he was "a little sad", and, "not myself today." Per pt's self inventory, pt rated his depression,hopelessness and anxiety a 0/2/2, respectively. Pt reported sleeping well last night, and endorsed having a good appetite, but rated his concentration as 'poor' and energy level as 'low'. Pt has been visible in the milieu and observed attending groups.  Pt currently denies SI/HI and AVH  A. Labs and vitals monitored. Pt given and educated on medications. Pt supported emotionally and encouraged to express concerns and ask questions.   R. Pt remains safe with 15 minute checks. Will continue POC.

## 2021-11-12 NOTE — [facility] Group Notes (Signed)
.  Psychoeducational Group Note    Date:  11/12/2021 Time: 1300-1400    Purpose of Group: . The group focus' on teaching patients on how to identify their needs and their Life Skills:  A group where two lists are made. What people need and what are things that we do that are unhealthy. The lists are developed by the patients and it is explained that we often do the actions that are not healthy to get our list of needs met.  Goal:: to develop the coping skills needed to get their needs met  Participation Level:  Active  Participation Quality:  Appropriate  Affect:  Appropriate  Cognitive:  Oriented  Insight: Improving  Engagement in Group:  Engaged  Modes of Intervention:  Activity, Discussion, Education, and Support  Additional Comments:  Pt rates his energy at a 10/10. Asked a lot of questions. Wants to understand how he can be a better father and a better person.  Paulino Rily

## 2021-11-12 NOTE — [facility] Group Notes (Signed)
Goals Group 11/12/2021   Group Focus: affirmation, clarity of thought, and goals/reality orientation Treatment Modality:  Psychoeducation Interventions utilized were assignment, group exercise, and support Purpose: To be able to understand and verbalize the reason for their admission to the hospital. To understand that the medication helps with their chemical imbalance but they also need to work on their choices in life. To be challenged to develop a list of 30 positives about themselves. Also introduce the concept that "feelings" are not reality.  Participation Level: did not attend  Chad Odonnell

## 2021-11-12 NOTE — Progress Notes (Signed)
Pt came up to this staff member at beginning of shift and stated that he was extremely anxious and agitated.  Pt states that situation with peer earlier had caused him to be triggered.  Pt given ordered prns and was able to hold himself together and remain in day room.  No further distress noted.  Will continue to closely monitor.

## 2021-11-12 NOTE — Progress Notes (Signed)
Upon assessment near the beginning of the shift, pt was resting in his room. Pt said that he still felt a little irritable/angry from the verbal altercation he had with one of his peers in the dayroom earlier. Pt said that he couldn't come to an agreement with the group with what to watch on TV. When this one peer left the dayroom, he changed the channel and she was upset when she came back. Pt said that he took his PRN vistaril, but it hadn't been effective. Pt said that he has been attending group and has learned about setting boundaries. Pt discussed being able to adapt to changes, "being in the even," and verbalizing your feelings to your loved ones. Pt reports eating 50% of his meals and fair sleep. Encouraged pt to work further on his suicide safety plan. Pt had decreased agitation/irritability at bedtime. Pt interacted with one of his peers, which helped to brighten his mood. Pt denies SI/HI and AVH. Active listening, reassurance, and support provided. Q 15 min safety checks continue. Pt's safety has been maintained.   11/11/21 2010  Psych Admission Type (Psych Patients Only)  Admission Status Involuntary  Psychosocial Assessment  Patient Complaints Anxiety;Depression;Irritability  Eye Contact Fair  Facial Expression Anxious;Flat  Affect Anxious;Depressed;Irritable  Speech Logical/coherent  Interaction Assertive  Motor Activity Other (Comment) (steady)  Appearance/Hygiene Unremarkable  Behavior Characteristics Cooperative;Anxious;Irritable  Mood Depressed;Anxious;Irritable;Pleasant  Thought Process  Coherency WDL  Content Blaming others  Delusions None reported or observed  Perception WDL  Hallucination None reported or observed  Judgment Limited  Confusion None  Danger to Self  Current suicidal ideation? Denies  Self-Injurious Behavior No self-injurious ideation or behavior indicators observed or expressed   Agreement Not to Harm Self Yes  Description of Agreement verbally  contracts for safety  Danger to Others  Danger to Others None reported or observed

## 2021-11-12 NOTE — Progress Notes (Signed)
[facility] Group Notes:  (Nursing/MHT/Case Management/Adjunct)  Date:  11/12/2021  Time:  2015  Type of Therapy:   wrap up group  Participation Level:  Active  Participation Quality:  Appropriate, Attentive, Sharing, and Supportive  Affect:  Excited  Cognitive:  Alert  Insight:  Improving  Engagement in Group:  Engaged  Modes of Intervention:  Clarification, Education, and Support  Summary of Progress/Problems: Positive thinking and positive change were discussed.   Marcille Buffy 11/12/2021, 9:31 PM

## 2021-11-13 MED ORDER — DOCUSATE SODIUM 100 MG PO CAPS
100.0000 mg | ORAL_CAPSULE | Freq: Every day | ORAL | Status: DC | PRN
  Administered 2021-11-13: 100 mg via ORAL
  Filled 2021-11-13: qty 1
  Filled 2021-11-13: qty 7

## 2021-11-13 NOTE — [facility] Group Notes (Signed)
Orientation Goals Group Community meeting was held, in which ward rules and unit schedule was reviewed. Patients were asked to complete self inventory, and patients were asked to share how they were feeling emotionally, and any goal they had for their mental health. Positive affirmations were shared with patients as well.  Patient attended and was appropriate. He states his goal is to '' maybe stay to myself since I'm a little agitated''

## 2021-11-13 NOTE — [facility] Group Notes (Signed)
Adult Psychoeducational Group  Date:  11/06/2021 Time:  1300-1400  Group Topic/Focus: Continuation of the group from Saturday. Looking at the lists that were created and talking about what needs to be done with the homework of 30 positives about themselves.                                     Talking about taking their power back and helping themselves to develop a positive self esteem.      Participation Quality:  Appropriate  Affect:  Appropriate  Cognitive:  Oriented  Insight: Improving  Engagement in Group:  Engaged  Modes of Intervention:  Activity, Discussion, Education, and Support  Additional Comments:  Rates his energy at an 8/10. Participated fully.  Dione Housekeeper

## 2021-11-13 NOTE — Progress Notes (Signed)
Patient presents with irritable, anxious mood, affect congruent. Chad Odonnell reports he is feeling a '' little bit agitated, anxious  today '' He states he had a dream about his daughter last night which brought up unsettling feelings. He states this was not a nightmare, but rather triggered feeling of missing his child and the ongoing custody conflict he has with his child's mother. '' Patient denies any SI or HI or A/V Hallucinations. Pt rates depression at 1/10 on scale 10 being worst, 4/10 on scale for hopelessness on same scale, and 5/10 for anxiety on same scale with 10 being worst. Patient states his goal is to '' smile '' and to be around peers.   Pt did request prn for anxiety, prn vistaril given. Pt has been visible on the unit. Able to verbally contract for safety and appears in no acute distress at this time. Pt is safe, will con't to monitor.

## 2021-11-13 NOTE — Progress Notes (Signed)
Adult Psychoeducational Group Note  Date:  11/13/2021 Time:  10:45 PM  Group Topic/Focus:  Wrap-Up Group:   The focus of this group is to help patients review their daily goal of treatment and discuss progress on daily workbooks.  Participation Level:  Active  Participation Quality:  Appropriate and Attentive  Affect:  Appropriate  Cognitive:  Alert and Appropriate  Insight: Appropriate  Engagement in Group:  Engaged  Modes of Intervention:  Discussion  Additional Comments:    Pt attended and actively participated in the [place] group. Pt rated his day 5/10. Pt explained that he had a situation that occurred today that triggered him. He stated he used his coping skills, removed himself for a moment to calm, used self-talk then returned once he was in control of himself. Pt was receptive to verbal praise.  Pt met his goal today of attending all scheduled groups.  Wetzel Bjornstad Charline Hoskinson 11/13/2021, 10:45 PM

## 2021-11-13 NOTE — [facility] Group Notes (Signed)
Psychoeducational Group Note- Patients were given education on recognizing signs of dysregulation. Patients were given the analogy of how our minds and cars are similar, and asked to reflect on signs in which ''their check engine light was on''  Patients were asked to list three signs they were dysregulated, and to identify three things that fuel them. Pt participated and was attentive.  

## 2021-11-13 NOTE — Progress Notes (Signed)
[facility] MD Progress Note  11/13/2021 2:36 PM Chad Odonnell  MRN:  [id number]  Subjective: Chad Odonnell states, "I feel good because I am being supported by the and also my peers."  Brief history: Chad Odonnell is a 21 year old male with a past medical history significant for multiple head traumas and substance use who was admitted involuntarily to the [facility] for increasing suicidal ideation and homicidal ideation.   The patient is being treated for the following psychiatric diagnoses:   MDD (major depressive disorder), recurrent severe, without psychosis    PTSD (post-traumatic stress disorder)   GAD (generalized anxiety disorder)   Nicotine use disorder   Cannabis abuse R/o impulse control d/o Cluster B traits   Yesterday the psychiatry team made the following recommendations -Discontinue Effexor 75mg  once daily, today 11-10-21 -Continue zyprexa 5 mg tid for mood stability and impulsivity -Start Prozac 20 mg once daily (first dose 8-25) -Continue Remeron 15mg  qhs for mdd, insomnia -Increase gabapentin 300 mg tid for anxiety, mood stability, and impulse control -continue Trazodone 50 mg nightly PRN for insomnia -continue hydroxyzine Q2 PRN for anxiety  Today's assessment: Patient is seen face-to-face and examined sitting up on a chair in the office.  Appears calm and cooperating with the exam.  Chart reviewed and findings shared with the treatment team and discussed with Dr. 11-22-1981.  Alert and oriented to person, place, time, and situation.  Patient reported to this provider, that he was upset at another patient because of her control of TV time.  Added that he is not upset today, because the situation was resolved.  Patient presents with less depressed mood and reiterated that the medications are working for him to stabilize his mood and anxiety. Patient reports sleeping over 8 hours last night and feeling restful. Patient appears to be managing his mood well at this  time. Reports having fair appetite and drinking enough fluid for hydration.  Patient denies any somatic GI complaint.  Encouraged to continue with his Ensure drinks p.o. daily for nutrition and nourishment.  He endorses suicidal thoughts, without intent or plans.  Added, "I am just thinking if I could go to sleep and never wake up, what it would feel like."  Encouraged patient to express his feelings to the staff when he has thoughts of suicide.  Denies homicidal ideation, paranoia, delusions or auditory/visual hallucinations. Reports having 1 soft bowel movement yesterday.  Patient continues on Colace 100 mg p.o. daily ordered for stool softener. Denies side effects to current scheduled psychiatric medications, and denies psychotic symptoms.  Principal Problem: MDD (major depressive disorder), recurrent severe, without psychosis (HCC) Diagnosis: Principal Problem:   MDD (major depressive disorder), recurrent severe, without psychosis (HCC) Active Problems:   PTSD (post-traumatic stress disorder)   GAD (generalized anxiety disorder)   Nicotine use disorder   Cannabis abuse  Total Time spent with patient: 35 minutes  Past Psychiatric History:  Previous Psych Diagnoses: Depression Prior inpatient treatment: [facility] in [place], [place] back in January or February 2023 Prior outpatient treatment: denies Prior rehab hx: denies Psychotherapy hx: denies History of suicide: six attempts, most recent was January 2023 History of homicide:  denies Psychiatric medication history: trialed Zoloft for a month Psychiatric medication compliance history: noncompliant, took zoloft prn Neuromodulation history: denies Current Psychiatrist: denies Current therapist: denies   Past Medical History:  Past Medical History:  Diagnosis Date   Enlarged kidney    right side   Testicle swelling right bigger than left  Past Surgical History:  Procedure Laterality Date   APPENDECTOMY     PYELOPLASTY Right  11/05/2013   [facility]   URETERAL STENT PLACEMENT  11/05/2013   Family History: History reviewed. No pertinent family history.  Family Psychiatric  History:  Per chart review history of depression in brother, father, grandfather (PTSD from war, agent orange exposure), schizophrenia in his uncle, anxiety/depression in mom    Social History:  Social History   Substance and Sexual Activity  Alcohol Use No     Social History   Substance and Sexual Activity  Drug Use Yes   Types: Marijuana   Comment: last use last week    Social History   Socioeconomic History   Marital status: Single    Spouse name: Not on file   Number of children: Not on file   Years of education: Not on file   Highest education level: Not on file  Occupational History   Not on file  Tobacco Use   Smoking status: Former    Packs/day: 0.50    Types: Cigars, Cigarettes   Smokeless tobacco: Current   Tobacco comments:    vape  Vaping Use   Vaping Use: Every day  Substance and Sexual Activity   Alcohol use: No   Drug use: Yes    Types: Marijuana    Comment: last use last week   Sexual activity: Yes  Other Topics Concern   Not on file  Social History Narrative   Not on file   Social Determinants of Health   Financial Resource Strain: Not on file  Food Insecurity: Not on file  Transportation Needs: Not on file  Physical Activity: Not on file  Stress: Not on file  Social Connections: Not on file   Additional Social History:    Sleep: Good  Appetite:  Good  Current Medications: Current Facility-Administered Medications  Medication Dose Route Frequency Provider Last Rate Last Admin   acetaminophen (TYLENOL) tablet 650 mg  650 mg Oral Q6H PRN Malkia Nippert, Kris Hartmann, FNP   650 mg at 11/11/21 1601   alum & mag hydroxide-simeth (MAALOX/MYLANTA) 200-200-20 MG/5ML suspension 30 mL  30 mL Oral Q4H PRN Keyauna Graefe, Kris Hartmann, FNP       feeding supplement (ENSURE ENLIVE / ENSURE PLUS) liquid 237 mL  237 mL Oral BID BM  Massengill, Nathan, MD   237 mL at 11/13/21 1434   FLUoxetine (PROZAC) capsule 20 mg  20 mg Oral Daily Massengill, Nathan, MD   20 mg at 11/13/21 0730   gabapentin (NEURONTIN) capsule 300 mg  300 mg Oral TID Janine Limbo, MD   300 mg at 11/13/21 1254   hydrOXYzine (ATARAX) tablet 25 mg  25 mg Oral Q2H PRN Massengill, Ovid Curd, MD   25 mg at 11/13/21 1205   LORazepam (ATIVAN) tablet 1 mg  1 mg Oral TID PRN Massengill, Ovid Curd, MD   1 mg at 11/12/21 2002   magnesium hydroxide (MILK OF MAGNESIA) suspension 30 mL  30 mL Oral Daily PRN Makenna Macaluso, Otila Kluver C, FNP   30 mL at 11/13/21 1205   mirtazapine (REMERON) tablet 15 mg  15 mg Oral QHS Rosezetta Schlatter, MD   15 mg at 11/12/21 2055   multivitamin with minerals tablet 1 tablet  1 tablet Oral Daily Massengill, Nathan, MD   1 tablet at 11/13/21 0730   nicotine (NICODERM CQ - dosed in mg/24 hours) patch 14 mg  14 mg Transdermal Daily Massengill, Ovid Curd, MD   14 mg at 11/13/21 [phone]  OLANZapine (ZYPREXA) injection 5 mg  5 mg Intramuscular TID PRN Massengill, Harrold Donath, MD       OLANZapine zydis (ZYPREXA) disintegrating tablet 5 mg  5 mg Oral TID PRN Massengill, Harrold Donath, MD   5 mg at 11/09/21 1558   OLANZapine zydis (ZYPREXA) disintegrating tablet 5 mg  5 mg Oral Q8H Massengill, Nathan, MD   5 mg at 11/13/21 0557   propranolol (INDERAL) tablet 10 mg  10 mg Oral Q12H Massengill, Harrold Donath, MD   10 mg at 11/13/21 0730   traZODone (DESYREL) tablet 50 mg  50 mg Oral QHS PRN Phineas Inches, MD   50 mg at 11/12/21 2056    Lab Results:  No results found for this or any previous visit (from the past 48 hour(s)).   Blood Alcohol level:  Lab Results  Component Value Date   ETH <10 11/05/2021    Metabolic Disorder Labs: Lab Results  Component Value Date   HGBA1C 4.5 (L) 11/07/2021   MPG 82.45 11/07/2021   No results found for: "PROLACTIN" Lab Results  Component Value Date   CHOL 139 11/07/2021   TRIG 71 11/07/2021   HDL 33 (L) 11/07/2021   CHOLHDL 4.2  11/07/2021   VLDL 14 11/07/2021   LDLCALC 92 11/07/2021    Physical Findings: AIMS: Facial and Oral Movements Muscles of Facial Expression: None, normal Lips and Perioral Area: None, normal Jaw: None, normal Tongue: None, normal,Extremity Movements Upper (arms, wrists, hands, fingers): None, normal Lower (legs, knees, ankles, toes): None, normal, Trunk Movements Neck, shoulders, hips: None, normal, Overall Severity Severity of abnormal movements (highest score from questions above): None, normal Incapacitation due to abnormal movements: None, normal Patient's awareness of abnormal movements (rate only patient's report): No Awareness, Dental Status Current problems with teeth and/or dentures?: No Does patient usually wear dentures?: No  CIWA:    COWS:     Musculoskeletal: Strength & Muscle Tone: within normal limits Gait & Station: normal Patient leans: N/A  Psychiatric Specialty Exam:  Presentation  General Appearance: Appropriate for Environment; Casual; Fairly Groomed  Eye Contact:Good  Speech:Clear and Coherent; Normal Rate  Speech Volume:Normal  Handedness:Right  Mood and Affect  Mood:Anxious  Affect:Appropriate; Congruent  Thought Process  Thought Processes:Coherent  Descriptions of Associations:Intact  Orientation:Full (Time, Place and Person)  Thought Content:Logical  History of Schizophrenia/Schizoaffective disorder:No data recorded Duration of Psychotic Symptoms:No data recorded Hallucinations:Hallucinations: None  Ideas of Reference:None  Suicidal Thoughts:Suicidal Thoughts: Yes, Passive SI Active Intent and/or Plan: -- (n/a) SI Passive Intent and/or Plan: Without Intent; Without Plan; Without Access to Means; Without Means to Carry Out  Homicidal Thoughts:Homicidal Thoughts: No HI Passive Intent and/or Plan: -- (n/a)   Sensorium  Memory:Immediate Fair; Recent Fair; Remote Fair  Judgment:Fair  Insight:Fair  Executive Functions   Concentration:Good  Attention Span:Good  Recall:Fair  Fund of Knowledge:Fair  Language:Good  Psychomotor Activity  Psychomotor Activity:Psychomotor Activity: Normal  Assets  Assets:Communication Skills; Physical Health; Social Support  Sleep  Sleep:Sleep: Good Number of Hours of Sleep: 8  Physical Exam: Physical Exam Vitals and nursing note reviewed.  Constitutional:      Appearance: Normal appearance.  HENT:     Head: Normocephalic and atraumatic.     Right Ear: External ear normal.     Left Ear: External ear normal.     Mouth/Throat:     Mouth: Mucous membranes are moist.     Pharynx: Oropharynx is clear.  Eyes:     Conjunctiva/sclera: Conjunctivae normal.     Pupils:  Pupils are equal, round, and reactive to light.  Cardiovascular:     Rate and Rhythm: Normal rate.     Pulses: Normal pulses.  Pulmonary:     Effort: Pulmonary effort is normal.  Abdominal:     Palpations: Abdomen is soft.  Genitourinary:    Comments: deferred Musculoskeletal:        General: Normal range of motion.     Cervical back: Normal range of motion and neck supple.  Skin:    General: Skin is warm.  Neurological:     General: No focal deficit present.     Mental Status: He is alert and oriented to person, place, and time.     Motor: No weakness.     Gait: Gait normal.  Psychiatric:        Behavior: Behavior normal.    Review of Systems  Constitutional:  Negative for chills and fever.  HENT: Negative.  Negative for hearing loss and tinnitus.   Eyes: Negative.  Negative for blurred vision and double vision.  Respiratory: Negative.  Negative for cough, sputum production, shortness of breath and wheezing.   Cardiovascular:  Negative for chest pain and palpitations.  Gastrointestinal: Negative.  Negative for heartburn and nausea.  Genitourinary: Negative.  Negative for dysuria, frequency and urgency.  Musculoskeletal: Negative.  Negative for myalgias and neck pain.  Skin:  Negative.  Negative for rash.  Neurological:  Negative for dizziness, tingling, tremors and headaches.  Endo/Heme/Allergies: Negative.  Negative for environmental allergies and polydipsia. Does not bruise/bleed easily.  Psychiatric/Behavioral:  Positive for depression, substance abuse and suicidal ideas. The patient is nervous/anxious. The patient does not have insomnia.    Blood pressure 114/77, pulse 94, temperature 98 F (36.7 C), temperature source Oral, resp. rate 16, height 5\' 9"  (1.753 m), weight 58.5 kg, SpO2 98 %. Body mass index is 19.05 kg/m.   Treatment Plan Summary: Daily contact with patient to assess and evaluate symptoms and progress in treatment  ASSESSMENT: Principal Problem:   MDD (major depressive disorder), recurrent severe, without psychosis ([place]) Active Problems:   PTSD (post-traumatic stress disorder)   GAD (generalized anxiety disorder)   Nicotine use disorder   Cannabis abuse Rule out impulse control disorder Cluster B traits     PLAN: Safety and Monitoring:             -- Involuntary admission to inpatient psychiatric unit for safety, stabilization and treatment             -- Daily contact with patient to assess and evaluate symptoms and progress in treatment             -- Patient's case to be discussed in multi-disciplinary team meeting             -- Observation Level : q15 minute checks             -- Vital signs:  q12 hours             -- Precautions: suicide, elopement, and assault   2. Psychiatric Diagnoses and Treatment:  #MDD #GAD -Continue zyprexa 5 mg tid for mood stability and impulsivity -Continue Prozac 20 mg once daily (first dose 8-24) -Continue Remeron 15mg  qhs for mdd, insomnia -Continue gabapentin 300 mg tid for anxiety, mood stability, and impulse control -continue Trazodone 50 mg nightly PRN for insomnia -Continue propranolol 10 mg bid for tachycardia and anxiety  -continue hydroxyzine Q2 PRN for anxiety  -Previously  discontinued Effexor 75mg  once daily, today  11-10-21   -- The risks/benefits/side-effects/alternatives to this medication were discussed in detail with the patient and time was given for questions. The patient consents to medication trial.              -- Metabolic profile and EKG monitoring obtained while on an atypical antipsychotic  BMI: 19.05 kg/m2 Lipid Panel: normal HbgA1c: 4.5 QTc: 399 from 11/06/21 @ 11:11             -- Encouraged patient to participate in unit milieu and in scheduled group therapies    3. Medical Diagnoses and Treatment: -nicotine use disorder Continue nicotine patch 14mg  once daily    4. Discharge Planning:              -- Social work and case management to assist with discharge planning and identification of hospital follow-up needs prior to discharge             -- Estimated LOS 5-7 days             -- Discharge Concerns: Need to establish a safety plan             -- Discharge Goals: Return home with outpatient referrals for mental health follow-up including medication management/psychotherapy    Laretta Bolster, [place] 11/13/2021, 2:36 PM  Patient ID: Chad Odonnell, male   DOB: 30-Sep-2000, 21 y.o.   MRN: [id number] Patient ID: Chad Odonnell, male   DOB: 04-19-2000, 21 y.o.   MRN: [id number]

## 2021-11-13 NOTE — Group Note (Signed)
  [facility] LCSW Group Therapy Note  Date/Time:  11/13/2021 10:00AM-11:00AM  Type of Therapy and Topic:  Group Therapy:  Ways to Love Myself and Take Care of Myself  Participation Level:  Active   Description of Group This process group started with group leader playing a song entitled "Love Me More" to facilitate a discussion about the need to love and respect ourselves and prioritize taking care of ourselves, especially after hospital discharge.   There was a discussion about the need for self-love, and patients listed ways in which they can demonstrate self-love.  Patients were then asked to share how they plan to take care of themselves in a better manner when they get home from the hospital.  Group members shared ideas about making changes when they return home so that they can stay well and in recovery.  Two more songs were played including "I Am Enough" and "My Own Hero" which led to further reinforcement of the ideas given.  Patients were emotional and/or supportive of those who became emotional.  Therapeutic Goals Patient will listen and be able to relate to a song about prioritizing themselves through self-love Patient will participate in generating ideas about healthy self-care options when they return to the community Patients will be supportive of one another and receive said support from others   Summary of Patient Progress:  The patient expressed that one way he can demonstrate self-love both during hospital stay and after discharge is to include positive influences in his life.  During group, patient introduced multiple tangents and was somewhat difficult to redirect.   Therapeutic Modalities Activity Motivational Interviewing Processing   Ambrose Mantle, LCSW 11/13/2021, 12:00pm

## 2021-11-13 NOTE — [facility] Group Notes (Signed)
Adult Psychoeducational Group Note Date:  11/06/2021 Time:  7116-5790 Group Topic/Focus: PROGRESSIVE RELAXATION. A group where deep breathing is taught and tensing and relaxation muscle groups is used. Imagery is used as well.  Pts are asked to imagine 3 pillars that hold them up when they are not able to hold themselves up and to share that with the group.  Participation Level:  Active  Participation Quality:  Appropriate  Affect:  Appropriate  Cognitive:  Oriented  Insight: Improving  Engagement in Group:  Engaged  Modes of Intervention:  Activity, Discussion, Education, and Support  Additional Comments:  Rates his  energy at 5/10. States love of others holds him up as well as being under water.  Dione Housekeeper

## 2021-11-13 NOTE — Progress Notes (Signed)
   11/13/21 2211  Psych Admission Type (Psych Patients Only)  Admission Status Involuntary  Psychosocial Assessment  Patient Complaints Anxiety  Eye Contact Fair  Facial Expression Animated  Affect Appropriate to circumstance  Speech Logical/coherent  Interaction Assertive  Motor Activity Fidgety  Appearance/Hygiene Unremarkable  Behavior Characteristics Appropriate to situation  Mood Anxious;Pleasant  Thought Process  Coherency WDL  Content WDL  Delusions None reported or observed  Perception WDL  Hallucination None reported or observed  Judgment Poor  Confusion None  Danger to Self  Current suicidal ideation? Denies  Self-Injurious Behavior No self-injurious ideation or behavior indicators observed or expressed   Agreement Not to Harm Self Yes  Description of Agreement verbal  Danger to Others  Danger to Others None reported or observed

## 2021-11-14 MED ORDER — OLANZAPINE 5 MG PO TABS: 5.0000 mg | ORAL_TABLET | Freq: Three times a day (TID) | ORAL | 0 refills | Status: AC

## 2021-11-14 MED ORDER — NICOTINE 14 MG/24HR TD PT24
14.0000 mg | MEDICATED_PATCH | Freq: Every day | TRANSDERMAL | 0 refills | Status: AC
Start: 2021-11-15 — End: ?

## 2021-11-14 MED ORDER — MIRTAZAPINE 15 MG PO TABS: 15.0000 mg | ORAL_TABLET | Freq: Every day | ORAL | 0 refills | Status: AC

## 2021-11-14 MED ORDER — OLANZAPINE 5 MG PO TABS
5.0000 mg | ORAL_TABLET | Freq: Three times a day (TID) | ORAL | Status: DC
  Filled 2021-11-14 (×3): qty 21

## 2021-11-14 MED ORDER — LACTULOSE 10 GM/15ML PO SOLN: 30.0000 g | Freq: Every day | ORAL | Status: DC | PRN

## 2021-11-14 MED ORDER — FLUOXETINE HCL 20 MG PO CAPS
20.0000 mg | ORAL_CAPSULE | Freq: Every day | ORAL | 0 refills | Status: AC
Start: 2021-11-15 — End: 2021-12-15

## 2021-11-14 MED ORDER — DOCUSATE SODIUM 100 MG PO CAPS: 100.0000 mg | ORAL_CAPSULE | Freq: Every day | ORAL | 0 refills | Status: AC | PRN

## 2021-11-14 MED ORDER — TRAZODONE HCL 50 MG PO TABS: 50.0000 mg | ORAL_TABLET | Freq: Every evening | ORAL | 0 refills | Status: AC | PRN

## 2021-11-14 MED ORDER — HYDROXYZINE HCL 25 MG PO TABS: 25.0000 mg | ORAL_TABLET | ORAL | 0 refills | Status: AC | PRN

## 2021-11-14 MED ORDER — GABAPENTIN 300 MG PO CAPS: 300.0000 mg | ORAL_CAPSULE | Freq: Three times a day (TID) | ORAL | 0 refills | Status: AC

## 2021-11-14 MED ORDER — PROPRANOLOL HCL 10 MG PO TABS: 10.0000 mg | ORAL_TABLET | Freq: Two times a day (BID) | ORAL | 0 refills | Status: AC

## 2021-11-14 NOTE — Progress Notes (Signed)
  [facility] Adult Case Management Discharge Plan :  Will you be returning to the same living situation after discharge:  Yes,  Patient is going home with his mother At discharge, do you have transportation home?: Yes,  Mother is picking him up  Do you have the ability to pay for your medications: Yes,  Insured  Release of information consent forms completed and in the chart;  Patient's signature needed at discharge.  Patient to Follow up at:  Follow-up Information     [facility], [facility] Follow up.   Why: Once you have switched your medicaid to [place], you may go to this provider for therapy and medication management services. Contact information: [address] [place] [address] [phone]         [facility] Go to.   Specialty: Professional Counselor Why: You may go to this provider for therapy services during walk in hours for new patients, Monday through Friday, from 9 am to 1 pm.  They do accept patients who live in [place]. Contact information: [address] [place] [address] [phone]         [facility]. Go on 11/29/2021.   Why: You have an appointment to establish care for medication management and therapy services on 11/29/21 at 8:25 am. This appointment will be held in the office via webex. *Please bring financial information such as any income, your photo ID, current insurance card and recent pay stubs so that you may be assessed for sliding scale fees. * New Address:  [address] [place], [place].  New Phone #:  [phone] Contact information: [address] [place] [address] [phone]                 Next level of care provider has access to [facility] Link:no  Safety Planning and Suicide Prevention discussed: Lyman Bishop, mother [phone]     Has patient been referred to the Quitline?: Patient refused referral  Patient has been referred for addiction  treatment: N/A  Ane Payment, LCSWA 11/14/2021, 10:08 AM

## 2021-11-14 NOTE — Group Note (Signed)
Recreation Therapy Group Note   Group Topic:Stress Management  Group Date: 11/14/2021 Start Time: 0940 End Time: 0955 Facilitators: Caroll Rancher, [place] Location: [address] Day[address]   Goal Area(s) Addresses:  Patient will identify positive stress management techniques. Patient will identify benefits of using stress management post d/c.  Group Description:  Meditation.  LRT played meditation that focused on making choices.  It talked about how thought can creep into your mind during meditation and how you have a choice to push those thoughts.  It also expressed how we have choices throughout the day and how those choices can impact the day ahead.   Affect/Mood: Appropriate   Participation Level: Engaged   Participation Quality: Independent   Behavior: Appropriate   Speech/Thought Process: Focused   Insight: Good   Judgement: Good   Modes of Intervention: Meditation   Patient Response to Interventions:  Engaged   Education Outcome:  Acknowledges education and In group clarification offered    Clinical Observations/Individualized Feedback: Pt attended and participated in group session.     Plan: Continue to engage patient in RT group sessions 2-3x/week.   Caroll Rancher, LRT,CTRS  11/14/2021 11:53 AM

## 2021-11-14 NOTE — [facility] Group Notes (Signed)
Spiritual care group on grief and loss facilitated by chaplain Dyanne Carrel, [facility]   Group Goal:   Support / Education around grief and loss   Members engage in facilitated group support and psycho-social education.   Group Description:   Following introductions and group rules, group members engaged in facilitated group dialog and support around topic of loss, with particular support around experiences of loss in their lives. Group Identified types of loss (relationships / self / things) and identified patterns, circumstances, and changes that precipitate losses. Reflected on thoughts / feelings around loss, normalized grief responses, and recognized variety in grief experience. Group noted Worden's four tasks of grief in discussion.   Group drew on Adlerian / Rogerian, narrative, MI,   Patient Progress: [name] attended group and actively engaged in the group conversation.  He shared about the loss of his brother who also was a father figure to him.  He also shared about how the lack of affection when he was a child has affected him.  He was able to receive support from peers and support them in turn.  [address], Bcc Pager, [phone]

## 2021-11-14 NOTE — Group Note (Signed)
[facility] LCSW Group Therapy Note   Group Date: 11/14/2021 Start Time: 1300 End Time: 1400   Type of Therapy and Topic: Group Therapy: Avoiding Self-Sabotaging and Enabling Behaviors  Participation Level: Did Not Attend  Mood:  Description of Group:  In this group, patients will learn how to identify obstacles, self-sabotaging and enabling behaviors, as well as: what are they, why do we do them and what needs these behaviors meet. Discuss unhealthy relationships and how to have positive healthy boundaries with those that sabotage and enable. Explore aspects of self-sabotage and enabling in yourself and how to limit these self-destructive behaviors in everyday life.   Therapeutic Goals: 1. Patient will identify one obstacle that relates to self-sabotage and enabling behaviors 2. Patient will identify one personal self-sabotaging or enabling behavior they did prior to admission 3. Patient will state a plan to change the above identified behavior 4. Patient will demonstrate ability to communicate their needs through discussion and/or role play.    Summary of Patient Progress:   Patient did not attend group despite encouraged participation.    Therapeutic Modalities:  Cognitive Behavioral Therapy Person-Centered Therapy Motivational Interviewing    Corky Crafts, [place]

## 2021-11-14 NOTE — Plan of Care (Signed)
Nurse discussed anxiety, depression and coping skills with patient.  
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
## 2021-11-14 NOTE — Progress Notes (Signed)
D:  Patient's self inventory sheet, patient sleeps good, sleep medication helpful.  Fair appetite, normal energy level, good concentration.  Rated depression 1, anxiety 5, denied hopeless.  Denied withdrawals.  Denied SI.  Denied physical problems.  Denied physical pain.  Goal is smile.  Plans to be near peers.  Discharge plans.  "I have to do it because no one else will." A:  Medications administered per MD orders.  Emotional support and encouragement given patient. R:  Denied SI and HI, contracts for safety.  Dear A/V hallucinations.  Safety maintained with 15 minute checks.

## 2021-11-14 NOTE — Progress Notes (Signed)
ischarge Note:  Patient discharged home with mother.  Suicide prevention information given and discussed with patient who stated he understood and had no questions.  Patient denied SI and HI.  Denied A/V hallucinations.  Patient stated he received all his belongings, clothing, toiletries, misc items, etc.  Patient stated he appreciated all assistance received from [facility] staff.  All required discharge information given.
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
